# Patient Record
Sex: Female | Born: 1970 | Race: White | Hispanic: No | Marital: Married | Smoking: Never smoker
Health system: Southern US, Community
[De-identification: ages and names within clinical notes are randomized; demographics above are authoritative.]

## PROBLEM LIST (undated history)

## (undated) DIAGNOSIS — T783XXA Angioneurotic edema, initial encounter: Secondary | ICD-10-CM

## (undated) DIAGNOSIS — J302 Other seasonal allergic rhinitis: Secondary | ICD-10-CM

## (undated) DIAGNOSIS — M797 Fibromyalgia: Secondary | ICD-10-CM

## (undated) DIAGNOSIS — Z9889 Other specified postprocedural states: Secondary | ICD-10-CM

## (undated) DIAGNOSIS — E063 Autoimmune thyroiditis: Secondary | ICD-10-CM

## (undated) DIAGNOSIS — E785 Hyperlipidemia, unspecified: Secondary | ICD-10-CM

## (undated) DIAGNOSIS — R112 Nausea with vomiting, unspecified: Secondary | ICD-10-CM

## (undated) DIAGNOSIS — N951 Menopausal and female climacteric states: Secondary | ICD-10-CM

## (undated) DIAGNOSIS — G43909 Migraine, unspecified, not intractable, without status migrainosus: Secondary | ICD-10-CM

## (undated) DIAGNOSIS — K5900 Constipation, unspecified: Secondary | ICD-10-CM

## (undated) DIAGNOSIS — E039 Hypothyroidism, unspecified: Secondary | ICD-10-CM

## (undated) DIAGNOSIS — B019 Varicella without complication: Secondary | ICD-10-CM

## (undated) DIAGNOSIS — L509 Urticaria, unspecified: Secondary | ICD-10-CM

## (undated) DIAGNOSIS — K589 Irritable bowel syndrome without diarrhea: Secondary | ICD-10-CM

## (undated) HISTORY — DX: Urticaria, unspecified: L50.9

## (undated) HISTORY — DX: Other seasonal allergic rhinitis: J30.2

## (undated) HISTORY — DX: Fibromyalgia: M79.7

## (undated) HISTORY — DX: Angioneurotic edema, initial encounter: T78.3XXA

## (undated) HISTORY — PX: COLONOSCOPY: SHX174

## (undated) HISTORY — DX: Autoimmune thyroiditis: E06.3

## (undated) HISTORY — DX: Irritable bowel syndrome, unspecified: K58.9

## (undated) HISTORY — PX: WISDOM TOOTH EXTRACTION: SHX21

## (undated) HISTORY — DX: Constipation, unspecified: K59.00

## (undated) HISTORY — DX: Varicella without complication: B01.9

## (undated) HISTORY — DX: Menopausal and female climacteric states: N95.1

## (undated) HISTORY — DX: Hyperlipidemia, unspecified: E78.5

## (undated) HISTORY — DX: Migraine, unspecified, not intractable, without status migrainosus: G43.909

---

## 2002-12-16 ENCOUNTER — Inpatient Hospital Stay (HOSPITAL_COMMUNITY): Admission: AD | Admit: 2002-12-16 | Discharge: 2002-12-18 | Payer: Self-pay | Admitting: Obstetrics and Gynecology

## 2003-02-01 ENCOUNTER — Other Ambulatory Visit: Admission: RE | Admit: 2003-02-01 | Discharge: 2003-02-01 | Payer: Self-pay | Admitting: Obstetrics and Gynecology

## 2004-02-17 ENCOUNTER — Other Ambulatory Visit: Admission: RE | Admit: 2004-02-17 | Discharge: 2004-02-17 | Payer: Self-pay | Admitting: Obstetrics and Gynecology

## 2004-06-24 HISTORY — PX: DILATION AND CURETTAGE OF UTERUS: SHX78

## 2004-07-28 ENCOUNTER — Encounter (INDEPENDENT_AMBULATORY_CARE_PROVIDER_SITE_OTHER): Payer: Self-pay | Admitting: *Deleted

## 2004-07-28 ENCOUNTER — Ambulatory Visit (HOSPITAL_COMMUNITY): Admission: RE | Admit: 2004-07-28 | Discharge: 2004-07-28 | Payer: Self-pay | Admitting: Obstetrics and Gynecology

## 2004-09-13 ENCOUNTER — Emergency Department (HOSPITAL_COMMUNITY): Admission: EM | Admit: 2004-09-13 | Discharge: 2004-09-14 | Payer: Self-pay | Admitting: Emergency Medicine

## 2005-02-06 ENCOUNTER — Other Ambulatory Visit: Admission: RE | Admit: 2005-02-06 | Discharge: 2005-02-06 | Payer: Self-pay | Admitting: Obstetrics and Gynecology

## 2005-05-30 ENCOUNTER — Inpatient Hospital Stay (HOSPITAL_COMMUNITY): Admission: AD | Admit: 2005-05-30 | Discharge: 2005-05-30 | Payer: Self-pay | Admitting: Obstetrics and Gynecology

## 2005-05-31 ENCOUNTER — Ambulatory Visit (HOSPITAL_COMMUNITY): Admission: RE | Admit: 2005-05-31 | Discharge: 2005-05-31 | Payer: Self-pay | Admitting: Obstetrics and Gynecology

## 2005-08-14 ENCOUNTER — Inpatient Hospital Stay (HOSPITAL_COMMUNITY): Admission: AD | Admit: 2005-08-14 | Discharge: 2005-08-16 | Payer: Self-pay | Admitting: Obstetrics and Gynecology

## 2007-05-14 ENCOUNTER — Encounter: Payer: Self-pay | Admitting: Cardiology

## 2007-05-14 ENCOUNTER — Ambulatory Visit: Payer: Self-pay | Admitting: Internal Medicine

## 2007-05-14 LAB — CONVERTED CEMR LAB
ALT: 23 units/L (ref 0–35)
AST: 24 units/L (ref 0–37)
Albumin: 4.3 g/dL (ref 3.5–5.2)
Basophils Absolute: 0 10*3/uL (ref 0.0–0.1)
Bilirubin, Direct: 0.1 mg/dL (ref 0.0–0.3)
Calcium: 9.7 mg/dL (ref 8.4–10.5)
Chloride: 107 meq/L (ref 96–112)
Eosinophils Absolute: 0.1 10*3/uL (ref 0.0–0.6)
GFR calc Af Amer: 120 mL/min
GFR calc non Af Amer: 100 mL/min
H Pylori IgG: NEGATIVE
Iron: 117 ug/dL (ref 42–145)
Lymphocytes Relative: 33.6 % (ref 12.0–46.0)
MCHC: 35.1 g/dL (ref 30.0–36.0)
MCV: 91.7 fL (ref 78.0–100.0)
Monocytes Relative: 10.5 % (ref 3.0–11.0)
Neutro Abs: 2.5 10*3/uL (ref 1.4–7.7)
Platelets: 179 10*3/uL (ref 150–400)
RBC: 4.15 M/uL (ref 3.87–5.11)
Saturation Ratios: 31.1 % (ref 20.0–50.0)
Sed Rate: 10 mm/hr (ref 0–25)
Sodium: 141 meq/L (ref 135–145)

## 2007-05-27 ENCOUNTER — Ambulatory Visit (HOSPITAL_COMMUNITY): Admission: RE | Admit: 2007-05-27 | Discharge: 2007-05-27 | Payer: Self-pay | Admitting: Internal Medicine

## 2007-05-29 ENCOUNTER — Ambulatory Visit: Payer: Self-pay | Admitting: Internal Medicine

## 2007-06-03 ENCOUNTER — Ambulatory Visit: Payer: Self-pay | Admitting: Internal Medicine

## 2007-06-03 ENCOUNTER — Encounter: Payer: Self-pay | Admitting: Internal Medicine

## 2007-07-21 DIAGNOSIS — E039 Hypothyroidism, unspecified: Secondary | ICD-10-CM | POA: Insufficient documentation

## 2007-07-21 DIAGNOSIS — K589 Irritable bowel syndrome without diarrhea: Secondary | ICD-10-CM | POA: Insufficient documentation

## 2008-04-10 ENCOUNTER — Inpatient Hospital Stay (HOSPITAL_COMMUNITY): Admission: AD | Admit: 2008-04-10 | Discharge: 2008-04-12 | Payer: Self-pay | Admitting: Obstetrics and Gynecology

## 2008-07-22 ENCOUNTER — Ambulatory Visit (HOSPITAL_COMMUNITY): Admission: RE | Admit: 2008-07-22 | Discharge: 2008-07-22 | Payer: Self-pay | Admitting: Cardiovascular Disease

## 2009-09-27 ENCOUNTER — Encounter: Admission: RE | Admit: 2009-09-27 | Discharge: 2009-09-27 | Payer: Self-pay | Admitting: Endocrinology

## 2010-01-02 ENCOUNTER — Encounter: Admission: RE | Admit: 2010-01-02 | Discharge: 2010-01-02 | Payer: Self-pay | Admitting: Family Medicine

## 2010-07-15 ENCOUNTER — Encounter: Payer: Self-pay | Admitting: Internal Medicine

## 2010-11-06 NOTE — Discharge Summary (Signed)
[name] [name], [name] [name]             ACCOUNT NO.:  [id number]   MEDICAL RECORD NO.:  [id number]          PATIENT TYPE:  INP   LOCATION:  9117                          [facility]ILITY:  [facility]   PHYSICIAN:  Zenaida Niece, M.D.DATE OF BIRTH:  1970-09-07   DATE OF ADMISSION:  04/10/2008  DATE OF DISCHARGE:  04/12/2008                               DISCHARGE SUMMARY   ADMISSION DIAGNOSES:  1. Intrauterine pregnancy at 38-plus weeks.  2. Hypothyroidism.   DISCHARGE DIAGNOSES:  1. Intrauterine pregnancy at 38-plus weeks.  2. Hypothyroidism.   PROCEDURES:  On April 10, 2008, she had a spontaneous vaginal  delivery.   HISTORY AND PHYSICAL:  This is a 40 year old gravida 7, para 3-1-2-4  with an EGA of 38-plus weeks, who presents with a complaint of regular  contractions.  Evaluation in triage revealed contractions, and cervix  was 7-cm dilated with bulging bag of water.  Prenatal care was  complicated by advanced maternal age with a normal first-trimester  screening and normal ultrasound and was otherwise uncomplicated.   PRENATAL LABORATORIES:  Blood type is A negative with negative antibody  screen.  RPR nonreactive.  Rubella immune.  Hepatitis B surface antigen  negative.  HIV negative.  Gonorrhea and chlamydia negative.  First-  trimester screen is normal.  One-hour Glucola was 122.  Group B strep is  negative.   PAST OB HISTORY:  Vaginal delivery x4 at 36-40 weeks with the weights  ranging from 6 pounds 5 ounces and 8 pounds 7 ounces without  complications.  Spontaneous abortion x2.   PAST MEDICAL HISTORY:  1. History of mitral valve prolapse.  2. Hypothyroidism.  3. Migraine headaches.   MEDICATIONS:  Synthroid.   PHYSICAL EXAMINATION:  VITAL SIGNS:  She is afebrile with stable vital  signs.  Fetal heart tracing reactive with contractions every 4-5  minutes.  ABDOMEN:  Gravid and nontender with an estimated fetal weight of 7-1/2  pounds.  Cervix, on my first exam she is 8-9,  complete, 0, vertex  presentation, and membranes were ruptured for clear fluid.   HOSPITAL COURSE:  The patient was admitted in advanced labor.  Membranes  were ruptured.  She progressed quickly to complete, pushed well, and on  the morning of April 10, 2008, she had a vaginal delivery of a viable  female infant with Apgars of 9 and 9, weight 7 pounds 13 ounces.  Placenta delivered spontaneously and was intact.  She had a small first-  degree laceration repaired with 3-0 Vicryl with local block and  estimated blood loss was 500 mL.  Postpartum, she had no significant  complications.  Pre-delivery hemoglobin 13.5, post-delivery 11.1.  The  patient requested discharge on postpartum day #1, but the baby was not  allowed to go, so on postpartum day #2, she was discharged to home.   DISCHARGE INSTRUCTIONS:  Regular diet, pelvic rest, and follow up in 6  weeks.   MEDICATIONS:  Over-the-counter ibuprofen as needed and she was given our  discharge pamphlet.      Zenaida Niece, M.D.  Electronically Signed     TDM/MEDQ  D:  04/12/2008  T:  04/12/2008  Job:  [id number]

## 2010-11-06 NOTE — Assessment & Plan Note (Signed)
[facility]                         GASTROENTEROLOGY OFFICE NOTE   NAME:[name] [name] [name]                    MRN:          [id number]  DATE:05/14/2007                            DOB:          03/13/1971    NEW PATIENT EVALUATION   [name] [name] is a very nice 40 year old white female, mother of 4 children,  who seeks another opinion of irritable bowel syndrome/crampy abdominal  pain and bloating.  She saw Dr. Arlyce Dice in 2004 and underwent  colonoscopy, which was a normal exam, including random biopsies of the  colon.  She continues to have crampy abdominal pain, which starts in the  morning and is independent of eating.  She also feels some pain within  10 or 15 minutes after eating.  She is almost constantly aware of her  lower abdomen.  She denies any diarrhea or constipation.  Her bowel  habits are somewhat irregular.  She does not take any laxatives.  There  has been no blood per rectum.  She has had some pain with intercourse on  the rectal side of the vagina.  Her weight has been stable.  Eating  habits have been regular.  She has been quite busy being the mother of 4  children and working part-time as a Nurse, learning disability.  She has been  treated for hypothyroidism by Dr. Everardo All.   MEDICATIONS:  Synthroid.  Prenatal vitamin.  Calcium supplements.  Fish  oil.   PAST MEDICAL HISTORY:  Thyroid problems.   OPERATIONS:  None.  Four vaginal deliveries.   FAMILY HISTORY:  Positive for diabetes in grandparents and mother.  History of gallbladder disease in her mother with cholecystectomy age of  91.  Also, mother has abdominal pain.  There is a positive family  history of colon cancer in mother, sister, and maternal grandmother.  Positive family history of colon polyps in her mother.  She had an upper  endoscopy in 2001.  We do not have the record of it.   SOCIAL HISTORY:  Married with 4 children.  She has a master's degree.  Works as an Secondary school teacher at  [employer].  She does not smoke.  Does not drink  alcohol.   REVIEW OF SYSTEMS:  Positive for night sweats, back pain, dyspareunia,  arthritis.   PHYSICAL EXAM:  Blood pressure 94/70, pulse 82, weight 115 pounds.  She was alert and oriented, somewhat pale.  Fair skin.  No palmar erythema.  Oral cavity shows normal papillated tongue.  No cheilosis.  No aphthous  ulcers.  NECK:  Supple.  No adenopathy.  Thyroid is not enlarged.  LUNGS:  Clear to auscultation.  COR:  Normal S1, normal S2.  ABDOMEN:  Soft with minimal tenderness in the epigastric area, and some  discomfort in the left lower quadrant.  The right lower quadrant was  unremarkable.  Bowel sounds were hyperactive.  RECTAL:  Normal perianal area.  Rectal tone was normal.  There was some  discomfort on inner rectum.  Stool was Hemoccult negative.  EXTREMITIES:  No edema.   IMPRESSION:  33. A 40 year old white female with symptoms of boating, cramping  abdominal pain on a daily basis, previously evaluated with negative      colonoscopy.  She definitely has hyperactive bowel sounds, which      may indicate either irritable bowel syndrome, possibly bacterial      overgrowth, or some sort of malabsorption.  We need to rule out      celiac sprue, inflammatory bowel disease, lactose intolerance, or      possible metabolic causes, such as hypothyroidism.  2. Positive family history of gallbladder disease, rule out      symptomatic gallbladder disease.  3. Epigastric discomfort and dyspepsia, rule out gastritis,      Helicobacter pylori gastropathy.   PLAN:  1. Align samples given 1 p.o. daily.  2. Levsin sublingual 0.125 mg q.4h p.r.n. crampy abdominal pain.  3. Anusol HC suppositories for rectal discomfort, possibly      hemorrhoids.  4. Upper abdominal ultrasound.  5. Upper GI with small bowel follow-through.  6. CBC, CMET, sed rate, tissue transglutaminase levels, TSH, iron,      iron binding capacity, and H. pylori antibody.   I will see her      again in 4 to 6 weeks.     Hedwig Morton. Juanda Chance, MD  Electronically Signed    DMB/MedQ  DD: 05/14/2007  DT: 05/14/2007  Job #: [id number]   cc:   Tammy R. Collins [place], M.D.

## 2010-11-09 NOTE — H&P (Signed)
NAME:  [name] [name], [name] [name]             ACCOUNT NO.:  [id number]   MEDICAL RECORD NO.:  [id number]          PATIENT TYPE:  AMB   LOCATION:  SDC                           [facility]   PHYSICIAN:  Huel Cote, M.D. DATE OF BIRTH:  May 02, 1971   DATE OF ADMISSION:  07/28/2004  DATE OF DISCHARGE:                                HISTORY & PHYSICAL   The patient is a 40 year old, G5, P2-1-1-2 who is coming in for a dilation  and curettage given a finding of a nine week missed AB on her office  ultrasound at her prenatal visit.  By her LMP, the patient would be  approximately [redacted] weeks gestation; however, there was no fetal heart beat  noted on her physical examination and ultrasound confirmed a nine week fetal  demise.   PAST MEDICAL HISTORY:  Significant for allergic rhinitis, migraines,  hypothyroidism, irritable bowel syndrome and mitral valve prolapse.  She has  had no past surgeries.   PAST OBSTETRICAL HISTORY:  She had three spontaneous vaginal deliveries and  one miscarriage previously.   PAST GYN HISTORY:  Unremarkable.  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   CURRENT MEDICATIONS:  Synthroid.   ALLERGIES:  She has no known drug allergies.   PHYSICAL EXAMINATION:  VITAL SIGNS:  Weight is approximately 112 pounds,  blood pressure 100/70.  CARDIAC:  Regular rate and rhythm.  LUNGS:  Clear.  ABDOMEN:  Soft, nontender.  PELVIC:  She has normal external genitalia, cervix has no lesions, uterus is  approximately 9-10 weeks in size. Her adnexa are normal.  The patient was  counseled to the risks and benefits of D&C including possible uterine  perforation and bleeding.  She understands these risks and desires to  proceed with the procedure as stated.  The patient is Rh negative and will  require RhoGAM postoperatively. Also given the history of the balance  translocation,  it was discussed with her sending the fetal tissue for  chromosomes and she is agreeable to this.      KR/MEDQ  D:  07/27/2004  T:  07/27/2004  Job:  [id number]

## 2010-11-09 NOTE — Discharge Summary (Signed)
NAMEYALDA, HERD             ACCOUNT NO.:  [id number]   MEDICAL RECORD NO.:  [id number]          PATIENT TYPE:  INP   LOCATION:  9115                          [facility]   PHYSICIAN:  Malachi Pro. Ambrose Mantle, M.D. DATE OF BIRTH:  [birth year]   DATE OF ADMISSION:  08/14/2005  DATE OF DISCHARGE:                                 DISCHARGE SUMMARY   40 year old white married female para 3-0-2-3, gravida 41, [facility] August 23, 2005  by ultrasound admitted with premature rupture of the membranes.  Blood group  and type A- with negative antibody, nonreactive serology, rubella immune.  Hepatitis B surface antigen negative.  HIV declined.  GC and Chlamydia  negative.  One-hour Glucola 145.  Three-hour GTT 75, 139, 97, and 72.  AFP  was normal.  Group B Strep negative.  Vaginal ultrasound at January 25, 2005  crown rump length 2.59 cm 10 weeks 0 days, [facility] August 23, 2005.  Nuchal  translucency was normal.  Ultrasound on May 31, 2006 for size less than  dates showed normal growth.  Prenatal care was uncomplicated.  The patient  was seen in the office on the day of admission with a positive fern test.  She came here and the cervix was thought to be 6 cm.  Epidural was given.   PAST MEDICAL HISTORY:   ALLERGIES:  No known drug allergies.   ILLNESSES:  1.  Migraines.  2.  Mitral valve prolapse.  3.  Hypothyroidism.   OPERATIONS:  1.  1992 laparotomy.  2.  D&C in 2006.   Alcohol, tobacco, and drugs:  None.   FAMILY HISTORY:  Mother with high blood pressure, diabetes, anxiety, and  depression.   OBSTETRICAL HISTORY:  In August of 1999 a 6 pound 5 ounce female 36 weeks  vaginally.  2001 a spontaneous abortion.  January of 2002 an 8 pound 7 ounce  female vaginally.  June of 2004 a 7 pound 11 ounce female vaginally.  February  of 2006 fetal demise at 9 weeks followed by a D&C.   On admission her vital signs were normal.  Her physical examination was  normal.  The patient had been thought to be 6  cm but when I examined her she  was 4-5 cm, 80%.  Artificial rupture of the membranes produced bloody fluid.  The patient must have had a high leak because the amniotic sac was intact.  She then progressed to full dilatation, delivered spontaneously OA over a  second-degree midline laceration a living female infant 8 pounds 4 ounces by  Dr. Ambrose Mantle.  Apgars were 9 at one and 9 at five minutes.  There was one loop  of nuchal cord that was loose.  The uterus was normal, rectal negative.  The  patient had poor perineal support and weak sphincter.  Laceration repaired  with 3-0 Vicryl.  Blood loss about 400 mL.  Postpartum the patient did well  and was discharged on the second postpartum day.  Initial hemoglobin 12.5,  hematocrit 36.3, white count 9400, platelet count 173,000.  Follow-up  hemoglobin 11.2, platelet count 131,000.  RPR was  nonreactive.  The baby was  Rh negative.  The mother was not a candidate for RhoGAM.   FINAL DIAGNOSES:  Intrauterine pregnancy at 38+ weeks, delivered OA.   OPERATION:  1.  Spontaneous delivery OA.  2.  Repair of second-degree midline laceration.   FINAL CONDITION:  Improved.   INSTRUCTIONS:  Regular discharge instruction booklet.  Motrin 600 mg 24  tablets one every six hours as needed for pain.  Percocet 5/325 20 tablets  one every four to six hours as needed for pain.  Patient advised to return  to the office in six weeks for follow-up examination.      Malachi Pro. Ambrose Mantle, M.D.  Electronically Signed     TFH/MEDQ  D:  08/16/2005  T:  08/16/2005  Job:  [id number]

## 2010-11-09 NOTE — Op Note (Signed)
[name] [name], [name] [name]             ACCOUNT NO.:  [id number]   MEDICAL RECORD NO.:  [id number]          PATIENT TYPE:  AMB   LOCATION:  SDC                           [facility]   PHYSICIAN:  Zenaida Niece, M.D.DATE OF BIRTH:  1970-09-05   DATE OF PROCEDURE:  07/28/2004  DATE OF DISCHARGE:                                 OPERATIVE REPORT   PREOPERATIVE DIAGNOSIS:  Missed abortion at 9 weeks.   POSTOPERATIVE DIAGNOSIS:  Missed abortion at 9 weeks.   OPERATION:  Dilation and evacuation.   SURGEON:  Zenaida Niece, M.D.   ANESTHESIA:  Monitored anesthesia care.   SPECIMENS:  Products of conception.   ESTIMATED BLOOD LOSS:  50 mL.   FINDINGS:  9-week size retroverted uterus, and moderate products of  conception obtained.   PROCEDURE IN DETAIL:  The patient was taken to the operating room and placed  in the dorsal supine position.  She was given IV sedation and placed in  mobile stirrups.  The perineum and vagina were prepped and draped in the  usual sterile fashion and bladder drained with a red rubber catheter.  A  Graves speculum was inserted into the vagina and the anterior lip of the  cervix was grasped with a single-tooth tenaculum.  A paracervical block was  then performed with 16 mL of 2% lidocaine.  The uterus then sounded to 11  cm.  The cervix was gradually dilated to a size 27 dilator, to accommodate a  size 9 curved curette.  Suction curettage was then performed with return of  moderate products of conception.  Sharp curettage was then performed with  minimal tissue in the left fundus and good uterine cry in all quadrants.  Suction curettage was performed one more time with return of minimal blood.  The single-tooth tenaculum was removed and bleeding controlled with  pressure.  All instruments were then removed from the vagina.  The patient  tolerated the procedure well, and was taken to the recovery room in stable  condition.  Counts were correct.      TDM/MEDQ  D:  07/28/2004  T:  07/28/2004  Job:  [id number]

## 2010-11-09 NOTE — Discharge Summary (Signed)
NAME:  [name] [name], [name] [name]                       ACCOUNT NO.:  [id number]   MEDICAL RECORD NO.:  [id number]                   PATIENT TYPE:  INP   LOCATION:  9107                                 [facility]   PHYSICIAN:  Zenaida Niece, M.D.             DATE OF BIRTH:  [birth year]   DATE OF ADMISSION:  12/16/2002  DATE OF DISCHARGE:  12/18/2002                                 DISCHARGE SUMMARY   ADMISSION DIAGNOSIS:  Intrauterine pregnancy at 38 weeks.   DISCHARGE DIAGNOSIS:  Intrauterine pregnancy at 38 weeks.   PROCEDURE:  On December 16, 2002, she had [name] spontaneous vaginal delivery.   HISTORY AND PHYSICAL:  This is [name] 40 year old white female, gravida 4, para 1-  1-1-2, with an EGA at 77 weeks who presents for induction with [name] history of  [name] one hour labor and some irregular contractions.  Prenatal care initially  in [place].  Transferred to our practice at 32 weeks.  Prenatal care  complicated by the fact that by amniocentesis this baby is 36 XX and has [name]  balanced translocation.   PRENATAL LABORATORY DATA:  Blood type [name] negative with negative antibody  screen.  RPR nonreactive.  Rubella immune.  Hepatitis B surface antigen  negative.  Cystic fibrosis negative.  Three hour Glucola was normal.  Group  B Streptococcus was negative.   PAST OBSTETRICAL HISTORY:  In 1999, [name] vaginal delivery 6 pounds 5 ounces.  In 2002, [name] vaginal delivery 8 pounds 7 ounces, and one spontaneous abortion.   PAST MEDICAL HISTORY:  1. Hypothyroidism.  2. Mitral valve prolapse.   PAST SURGICAL HISTORY:  Laparoscopy in 1992.   MEDICATIONS:  1. Cipro.  2. Prenatal vitamins.   PHYSICAL EXAMINATION:  VITAL SIGNS:  She is afebrile with stable vital  signs.  Fetal heart tracing is reactive with contractions q.5-7 minutes.  ABDOMEN:  Gravid with estimated fetal weight of 6.5-7 pounds .  PELVIC:  Cervix is 2-3, 15, -2 and amniotomy reveals clear fluid.   HOSPITAL COURSE:  The patient was admitted with  contractions and Dr.  Senaida Ores performed amniotomy.  She continued to progress and received an  epidural.  She was also started on Pitocin.  She was given ampicillin for  mitral valve prolapse per patient's request.  On the afternoon of December 16, 2002, she had [name] vaginal delivery of [name] viable female infant with APGARS of  9/9 who weighed 7 pounds 11 ounces.  Placenta delivered spontaneously,  intact.  She had [name] small second degree laceration repaired with 2-0 Vicryl.  Estimated blood loss 300 mL.  Cord blood was collected.   Postpartum she breast and bottle fed her baby and had no complications.   Predelivery hemoglobin 11.6, postdelivery 9.9.  On the morning of postpartum  day #2, she was felt to be stable enough for discharge home.   DISCHARGE INSTRUCTIONS:  1. Regular diet.  2. Pelvic  rest.  3. Followup in six weeks.   MEDICATIONS:  Over-the-counter Motrin as needed.   She is given our discharge pamphlet.                                               Zenaida Niece, M.D.    TDM/MEDQ  D:  12/18/2002  T:  12/19/2002  Job:  [id number]

## 2010-11-13 ENCOUNTER — Other Ambulatory Visit: Payer: Self-pay | Admitting: Obstetrics & Gynecology

## 2010-11-13 DIAGNOSIS — N938 Other specified abnormal uterine and vaginal bleeding: Secondary | ICD-10-CM

## 2010-11-13 DIAGNOSIS — R1032 Left lower quadrant pain: Secondary | ICD-10-CM

## 2010-11-14 ENCOUNTER — Ambulatory Visit (HOSPITAL_COMMUNITY)
Admission: RE | Admit: 2010-11-14 | Discharge: 2010-11-14 | Disposition: A | Discharge status: Home / Self Care | Payer: BLUE CROSS/BLUE SHIELD | Source: Ambulatory Visit | Attending: Obstetrics & Gynecology | Admitting: Obstetrics & Gynecology

## 2010-11-14 DIAGNOSIS — R1032 Left lower quadrant pain: Secondary | ICD-10-CM

## 2010-11-14 DIAGNOSIS — N938 Other specified abnormal uterine and vaginal bleeding: Secondary | ICD-10-CM

## 2010-11-14 DIAGNOSIS — N949 Unspecified condition associated with female genital organs and menstrual cycle: Secondary | ICD-10-CM | POA: Insufficient documentation

## 2011-03-25 LAB — RPR: RPR Ser Ql: NONREACTIVE

## 2011-03-25 LAB — CBC
HCT: 39.9
MCHC: 34
MCV: 93.9
MCV: 94.8
Platelets: 132 — ABNORMAL LOW
Platelets: 152
RBC: 3.44 — ABNORMAL LOW
RDW: 13.2
RDW: 13.5
WBC: 9.7

## 2011-08-12 ENCOUNTER — Encounter (HOSPITAL_COMMUNITY): Payer: Self-pay | Admitting: *Deleted

## 2011-08-12 ENCOUNTER — Encounter (HOSPITAL_COMMUNITY): Payer: Self-pay | Admitting: Pharmacist

## 2011-08-13 NOTE — H&P (Addendum)
[name] [name] is an 41 y.o. female. [id number] with an unfortunate finding of a missed abortion on [place] and CRL of 8+weeks.  She has had no bleeding or cramping.  After discussion of options she elects to proceed with a suction D&E. We discussed risks of bleeding, infection and uterine perforation specifically.    Pertinent Gynecological History: No abnormal paps  Menstrual History:  LMP 05/27/11  Past Medical History  Diagnosis Date  . Hypothyroidism   . PONV (postoperative nausea and vomiting)     Past Surgical History  Procedure Date  . Dilation and curettage of uterus 2006    missed ab  . Colonoscopy     History reviewed. No pertinent family history.  Social History:  reports that she has never smoked. She does not have any smokeless tobacco history on file. She reports that she does not drink alcohol or use illicit drugs.  Allergies: No Known Allergies  No prescriptions prior to admission    ROS  There were no vitals taken for this visit. Physical Exam  Constitutional: She is oriented to person, place, and time. She appears well-developed and well-nourished.  Cardiovascular: Normal rate and regular rhythm.   Respiratory: Effort normal and breath sounds normal.  GI: Soft. Bowel sounds are normal.  Genitourinary: Vagina normal.       Uterus at about 8 weeks size  Neurological: She is alert and oriented to person, place, and time.  Psychiatric: She has a normal mood and affect. Her behavior is normal.      Assessment/Plan: Pt has a missed abortion and has elected to proceed with suction D&E after we discussed risks and benefits.  Her blood type is Rh negative and she will need rhogam post-operatively.  Cypher Paule W 08/13/2011, 11:04 AM  Per pt no changes in dictated H&P and brief exam WNL.

## 2011-08-14 ENCOUNTER — Ambulatory Visit (HOSPITAL_COMMUNITY)
Admission: RE | Admit: 2011-08-14 | Discharge: 2011-08-14 | Disposition: A | Discharge status: Home / Self Care | Payer: BC Managed Care – PPO | Source: Ambulatory Visit | Attending: Obstetrics and Gynecology | Admitting: Obstetrics and Gynecology

## 2011-08-14 ENCOUNTER — Encounter (HOSPITAL_COMMUNITY): Payer: Self-pay | Admitting: Anesthesiology

## 2011-08-14 ENCOUNTER — Encounter (HOSPITAL_COMMUNITY)
Admission: RE | Disposition: A | Discharge status: Home / Self Care | Payer: Self-pay | Source: Ambulatory Visit | Attending: Obstetrics and Gynecology

## 2011-08-14 ENCOUNTER — Ambulatory Visit (HOSPITAL_COMMUNITY): Payer: BC Managed Care – PPO | Admitting: Anesthesiology

## 2011-08-14 ENCOUNTER — Other Ambulatory Visit: Payer: Self-pay | Admitting: Obstetrics and Gynecology

## 2011-08-14 ENCOUNTER — Encounter (HOSPITAL_COMMUNITY): Payer: Self-pay | Admitting: *Deleted

## 2011-08-14 DIAGNOSIS — O021 Missed abortion: Secondary | ICD-10-CM | POA: Insufficient documentation

## 2011-08-14 HISTORY — DX: Nausea with vomiting, unspecified: Z98.890

## 2011-08-14 HISTORY — DX: Nausea with vomiting, unspecified: R11.2

## 2011-08-14 HISTORY — PX: DILATION AND EVACUATION: SHX1459

## 2011-08-14 HISTORY — DX: Hypothyroidism, unspecified: E03.9

## 2011-08-14 LAB — CBC
MCH: 30.5 pg (ref 26.0–34.0)
MCHC: 33.3 g/dL (ref 30.0–36.0)
Platelets: 205 10*3/uL (ref 150–400)
RDW: 13.8 % (ref 11.5–15.5)

## 2011-08-14 SURGERY — DILATION AND EVACUATION, UTERUS
Anesthesia: Monitor Anesthesia Care | Site: Uterus | Wound class: Clean Contaminated

## 2011-08-14 MED ORDER — KETOROLAC TROMETHAMINE 30 MG/ML IJ SOLN
INTRAMUSCULAR | Status: AC
  Filled 2011-08-14: qty 1

## 2011-08-14 MED ORDER — MIDAZOLAM HCL 5 MG/5ML IJ SOLN
INTRAMUSCULAR | Status: DC | PRN
  Administered 2011-08-14: 2 mg via INTRAVENOUS

## 2011-08-14 MED ORDER — RHO D IMMUNE GLOBULIN 1500 UNIT/2ML IJ SOLN
300.0000 ug | Freq: Once | INTRAMUSCULAR | Status: AC
  Administered 2011-08-14: 300 ug via INTRAVENOUS

## 2011-08-14 MED ORDER — FENTANYL CITRATE 0.05 MG/ML IJ SOLN
INTRAMUSCULAR | Status: AC
  Filled 2011-08-14: qty 2

## 2011-08-14 MED ORDER — LIDOCAINE HCL 1 % IJ SOLN
INTRAMUSCULAR | Status: DC | PRN
  Administered 2011-08-14: 20 mL

## 2011-08-14 MED ORDER — PROPOFOL 10 MG/ML IV EMUL
INTRAVENOUS | Status: DC | PRN
  Administered 2011-08-14: 200 ug/kg/min via INTRAVENOUS

## 2011-08-14 MED ORDER — KETOROLAC TROMETHAMINE 30 MG/ML IJ SOLN
30.0000 mg | Freq: Once | INTRAMUSCULAR | Status: AC
  Administered 2011-08-14: 30 mg via INTRAVENOUS

## 2011-08-14 MED ORDER — PROPOFOL 10 MG/ML IV EMUL
INTRAVENOUS | Status: DC | PRN
  Administered 2011-08-14: 50 mg via INTRAVENOUS

## 2011-08-14 MED ORDER — MIDAZOLAM HCL 2 MG/2ML IJ SOLN
INTRAMUSCULAR | Status: AC
  Filled 2011-08-14: qty 2

## 2011-08-14 MED ORDER — SILVER NITRATE-POT NITRATE 75-25 % EX MISC
CUTANEOUS | Status: DC | PRN
  Administered 2011-08-14: 1

## 2011-08-14 MED ORDER — LACTATED RINGERS IV SOLN
INTRAVENOUS | Status: DC
  Administered 2011-08-14: 12:00:00 via INTRAVENOUS

## 2011-08-14 MED ORDER — LIDOCAINE HCL (CARDIAC) 20 MG/ML IV SOLN
INTRAVENOUS | Status: DC | PRN
  Administered 2011-08-14: 60 mg via INTRAVENOUS

## 2011-08-14 MED ORDER — FENTANYL CITRATE 0.05 MG/ML IJ SOLN
INTRAMUSCULAR | Status: DC | PRN
  Administered 2011-08-14 (×2): 25 ug via INTRAVENOUS

## 2011-08-14 MED ORDER — ONDANSETRON HCL 4 MG/2ML IJ SOLN
INTRAMUSCULAR | Status: DC | PRN
  Administered 2011-08-14: 4 mg via INTRAVENOUS

## 2011-08-14 MED ORDER — FENTANYL CITRATE 0.05 MG/ML IJ SOLN: 25.0000 ug | INTRAMUSCULAR | Status: DC | PRN

## 2011-08-14 SURGICAL SUPPLY — 19 items
CATH ROBINSON RED A/P 16FR (CATHETERS) ×2 IMPLANT
CLOTH BEACON ORANGE TIMEOUT ST (SAFETY) ×2 IMPLANT
DECANTER SPIKE VIAL GLASS SM (MISCELLANEOUS) ×2 IMPLANT
GLOVE BIO SURGEON STRL SZ 6.5 (GLOVE) ×4 IMPLANT
GOWN PREVENTION PLUS LG XLONG (DISPOSABLE) ×4 IMPLANT
KIT BERKELEY 1ST TRIMESTER 3/8 (MISCELLANEOUS) ×2 IMPLANT
NDL SPNL 22GX3.5 QUINCKE BK (NEEDLE) ×1 IMPLANT
NEEDLE SPNL 22GX3.5 QUINCKE BK (NEEDLE) ×2 IMPLANT
NS IRRIG 1000ML POUR BTL (IV SOLUTION) ×2 IMPLANT
PACK VAGINAL MINOR WOMEN LF (CUSTOM PROCEDURE TRAY) ×2 IMPLANT
PAD PREP 24X48 CUFFED NSTRL (MISCELLANEOUS) ×2 IMPLANT
SET BERKELEY SUCTION TUBING (SUCTIONS) ×2 IMPLANT
SYR CONTROL 10ML LL (SYRINGE) ×2 IMPLANT
TOWEL OR 17X24 6PK STRL BLUE (TOWEL DISPOSABLE) ×4 IMPLANT
VACURETTE 10 RIGID CVD (CANNULA) IMPLANT
VACURETTE 7MM CVD STRL WRAP (CANNULA) IMPLANT
VACURETTE 8 RIGID CVD (CANNULA) IMPLANT
VACURETTE 9 RIGID CVD (CANNULA) ×2 IMPLANT
WATER STERILE IRR 1000ML POUR (IV SOLUTION) ×1 IMPLANT

## 2011-08-14 NOTE — Anesthesia Preprocedure Evaluation (Addendum)
Anesthesia Evaluation  Patient identified by MRN, date of birth, ID band Patient awake    Reviewed: Allergy & Precautions, H&P , Patient's Chart, lab work & pertinent test results, reviewed documented beta blocker date and time   Airway Mallampati: II TM Distance: >3 FB Neck ROM: full    Dental No notable dental hx.    Pulmonary  clear to auscultation  Pulmonary exam normal       Cardiovascular regular Normal    Neuro/Psych    GI/Hepatic   Endo/Other  Hypothyroidism   Renal/GU      Musculoskeletal   Abdominal   Peds  Hematology   Anesthesia Other Findings   Reproductive/Obstetrics                           Anesthesia Physical Anesthesia Plan  ASA: II  Anesthesia Plan: General and MAC   Post-op Pain Management:    Induction: Intravenous  Airway Management Planned: LMA and Mask  Additional Equipment:   Intra-op Plan:   Post-operative Plan:   Informed Consent: I have reviewed the patients History and Physical, chart, labs and discussed the procedure including the risks, benefits and alternatives for the proposed anesthesia with the patient or authorized representative who has indicated his/her understanding and acceptance.   Dental Advisory Given  Plan Discussed with: CRNA and Surgeon  Anesthesia Plan Comments: (  Discussed  MAC or general anesthesia, including possible nausea, instrumentation of airway, sore throat,pulmonary aspiration, etc. I asked if the were any outstanding questions, or  concerns before we proceeded. )       Anesthesia Quick Evaluation

## 2011-08-14 NOTE — Transfer of Care (Signed)
Immediate Anesthesia Transfer of Care Note  Patient: [name] [name]  Procedure(s) Performed: Procedure(s) (LRB): DILATATION AND EVACUATION (N/A)  Patient Location: PACU  Anesthesia Type: MAC  Level of Consciousness: awake, alert  and oriented  Airway & Oxygen Therapy: Patient Spontanous Breathing  Post-op Assessment: Report given to PACU RN  Post vital signs: Reviewed and stable  Complications: No apparent anesthesia complications

## 2011-08-14 NOTE — Discharge Instructions (Signed)
DISCHARGE INSTRUCTIONS: D&E The following instructions have been prepared to help you care for yourself upon your return home.   Personal hygiene: Marland Kitchen Use sanitary pads for vaginal drainage, not tampons. . Shower the day after your procedure. . NO tub baths, pools or Jacuzzis for 2-3 weeks. . Wipe front to back after using the bathroom.  Activity and limitations: . Do NOT drive or operate any equipment for 24 hours. The effects of anesthesia are still present and drowsiness may result. . Do NOT rest in bed all day. . Walking is encouraged. . Walk up and down stairs slowly. . You may resume your normal activity in one to two days or as indicated by your physician.  Sexual activity: NO intercourse for at least 2 weeks after the procedure, or as indicated by your physician.  Diet: Eat a light meal as desired this evening. You may resume your usual diet tomorrow.  Return to work: You may resume your work activities in one to two days or as indicated by your doctor.  What to expect after your surgery: Expect to have vaginal bleeding/discharge for 2-3 days and spotting for up to 10 days. It is not unusual to have soreness for up to 1-2 weeks. You may have a slight burning sensation when you urinate for the first day. Mild cramps may continue for a couple of days. You may have a regular period in 2-6 weeks.  Call your doctor for any of the following: . Excessive vaginal bleeding, saturating and changing one pad every hour. . Inability to urinate 6 hours after discharge from hospital. . Pain not relieved by pain medication. . Fever of 100.4 F or greater. . Unusual vaginal discharge or odor.

## 2011-08-14 NOTE — Op Note (Signed)
Operative note  Preoperative diagnosis Missed abortion at [redacted] weeks gestation crown-rump length  Postoperative diagnosis Same  Procedure Suction dilation and evacuation  Surgeon Dr. Huel Cote  Anesthesia Paracervical block with IV sedation  Findings The uterus measured approximately 9 cm and had a moderate amount of products of conception obtained  Fluids Estimated blood loss 50 cc Urine output 50 cc straight catheter prior procedure IV fluids 1100 cc LR  Pathology Products of conception were sent  Procedure note Patient was taken to the operating room where IV sedation was obtained without difficulty. An appropriate time out was performed and she was prepped and draped in the normal sterile fashion in the dorsal lithotomy position. A speculum was placed within the vagina and the cervix identified and injected with 2 cc of 1% plain lidocaine on the anterior lip of the cervix. This was then grasped with a single-tooth tenaculum and an additional 9 cc each was placed at 1 and 10:00 on the cervix for a paracervical block. The uterus was then easily sounded to 9 cm and a 9 cm suction curet obtained. The Pratt dilators were utilized and passed very easily do to the patient's prior Cytotec treatment. The 9 mm suction curette was introduced into the uterine fundus and into passes a moderate amount of products of conception obtained. On the third pass no further tissue was noted. The suction was discontinued and a curettage performed with no additional tissue felt in the uterine fundus. 2 additional passes of the suction confirmed that there is no significant tissue left behind and all instruments and sponges were then removed from the vagina. The tenaculum site was bleeding on th right and was treated with silver nitrate with good hemostasis.  All instruments and sponge counts were correct and the patient was taken to the recovery room in good condition.

## 2011-08-14 NOTE — Brief Op Note (Signed)
08/14/2011  12:38 PM  PATIENT:  [name] [name]  41 y.o. female  PRE-OPERATIVE DIAGNOSIS:  missed AB  POST-OPERATIVE DIAGNOSIS:  Missed Abortion  PROCEDURE:  Procedure(s) (LRB): DILATATION AND EVACUATION (N/A)  SURGEON:  Surgeon(s) and Role:    * Oliver Pila, MD - Primary   ANESTHESIA:   IV sedation and paracervical block  EBL:  Total I/O In: 700 [I.V.:700] Out: 25 [Urine:25]  BLOOD ADMINISTERED:none  DRAINS: none   LOCAL MEDICATIONS USED:  LIDOCAINE   SPECIMEN:  POC's  DISPOSITION OF SPECIMEN:  PATHOLOGY  COUNTS:  YES  TOURNIQUET:  * No tourniquets in log *  DICTATION: .Dragon Dictation  PLAN OF CARE: Discharge to home after PACU  PATIENT DISPOSITION:  PACU - hemodynamically stable.

## 2011-08-15 LAB — RH IG WORKUP (INCLUDES ABO/RH)
ABO/RH(D): A NEG
Antibody Screen: NEGATIVE
Unit division: 0

## 2011-08-15 NOTE — Anesthesia Postprocedure Evaluation (Signed)
  Anesthesia Post-op Note  Patient: [name] [name]  Procedure(s) Performed: Procedure(s) (LRB): DILATATION AND EVACUATION (N/A) Patient is awake and responsive. Pain and nausea are reasonably well controlled. Vital signs are stable and clinically acceptable. Oxygen saturation is clinically acceptable. There are no apparent anesthetic complications at this time. Patient is ready for discharge.

## 2011-08-16 ENCOUNTER — Encounter (HOSPITAL_COMMUNITY): Payer: Self-pay | Admitting: Obstetrics and Gynecology

## 2011-08-23 DEATH — deceased

## 2012-05-25 ENCOUNTER — Other Ambulatory Visit: Payer: Self-pay | Admitting: Obstetrics and Gynecology

## 2012-05-25 DIAGNOSIS — Z1231 Encounter for screening mammogram for malignant neoplasm of breast: Secondary | ICD-10-CM

## 2012-07-02 ENCOUNTER — Other Ambulatory Visit: Payer: Self-pay | Admitting: Endocrinology

## 2012-07-02 DIAGNOSIS — E049 Nontoxic goiter, unspecified: Secondary | ICD-10-CM

## 2012-07-03 ENCOUNTER — Ambulatory Visit
Admission: RE | Admit: 2012-07-03 | Discharge: 2012-07-03 | Disposition: A | Discharge status: Home / Self Care | Payer: BC Managed Care – PPO | Source: Ambulatory Visit | Attending: Endocrinology | Admitting: Endocrinology

## 2012-07-03 ENCOUNTER — Ambulatory Visit
Admission: RE | Admit: 2012-07-03 | Discharge: 2012-07-03 | Disposition: A | Discharge status: Home / Self Care | Payer: BC Managed Care – PPO | Source: Ambulatory Visit | Attending: Obstetrics and Gynecology | Admitting: Obstetrics and Gynecology

## 2012-07-03 DIAGNOSIS — Z1231 Encounter for screening mammogram for malignant neoplasm of breast: Secondary | ICD-10-CM

## 2012-07-03 DIAGNOSIS — E049 Nontoxic goiter, unspecified: Secondary | ICD-10-CM

## 2012-07-07 ENCOUNTER — Ambulatory Visit: Payer: BC Managed Care – PPO

## 2012-07-09 ENCOUNTER — Other Ambulatory Visit: Payer: Self-pay | Admitting: Obstetrics and Gynecology

## 2012-07-09 DIAGNOSIS — R928 Other abnormal and inconclusive findings on diagnostic imaging of breast: Secondary | ICD-10-CM

## 2012-07-14 ENCOUNTER — Ambulatory Visit
Admission: RE | Admit: 2012-07-14 | Discharge: 2012-07-14 | Disposition: A | Discharge status: Home / Self Care | Payer: BC Managed Care – PPO | Source: Ambulatory Visit | Attending: Obstetrics and Gynecology | Admitting: Obstetrics and Gynecology

## 2012-07-14 DIAGNOSIS — R928 Other abnormal and inconclusive findings on diagnostic imaging of breast: Secondary | ICD-10-CM

## 2012-12-07 ENCOUNTER — Other Ambulatory Visit: Payer: Self-pay | Admitting: Obstetrics and Gynecology

## 2012-12-07 DIAGNOSIS — R921 Mammographic calcification found on diagnostic imaging of breast: Secondary | ICD-10-CM

## 2012-12-23 ENCOUNTER — Ambulatory Visit
Admission: RE | Admit: 2012-12-23 | Discharge: 2012-12-23 | Disposition: A | Discharge status: Home / Self Care | Payer: BC Managed Care – PPO | Source: Ambulatory Visit | Attending: Obstetrics and Gynecology | Admitting: Obstetrics and Gynecology

## 2012-12-23 DIAGNOSIS — R921 Mammographic calcification found on diagnostic imaging of breast: Secondary | ICD-10-CM

## 2013-05-25 ENCOUNTER — Other Ambulatory Visit: Payer: Self-pay

## 2013-05-25 DIAGNOSIS — Z1231 Encounter for screening mammogram for malignant neoplasm of breast: Secondary | ICD-10-CM

## 2013-06-29 ENCOUNTER — Institutional Professional Consult (permissible substitution): Payer: BC Managed Care – PPO | Admitting: Nurse Practitioner

## 2013-07-05 ENCOUNTER — Ambulatory Visit: Payer: BC Managed Care – PPO

## 2013-07-06 ENCOUNTER — Ambulatory Visit
Admission: RE | Admit: 2013-07-06 | Discharge: 2013-07-06 | Disposition: A | Discharge status: Home / Self Care | Payer: BC Managed Care – PPO | Source: Ambulatory Visit

## 2013-07-06 DIAGNOSIS — Z1231 Encounter for screening mammogram for malignant neoplasm of breast: Secondary | ICD-10-CM

## 2014-06-24 HISTORY — PX: COLONOSCOPY: SHX174

## 2015-04-17 ENCOUNTER — Ambulatory Visit: Payer: BC Managed Care – PPO | Admitting: Podiatry

## 2015-12-15 ENCOUNTER — Ambulatory Visit: Payer: Self-pay

## 2015-12-15 ENCOUNTER — Telehealth: Payer: Self-pay | Admitting: *Deleted

## 2015-12-15 ENCOUNTER — Ambulatory Visit (INDEPENDENT_AMBULATORY_CARE_PROVIDER_SITE_OTHER): Payer: BC Managed Care – PPO

## 2015-12-15 ENCOUNTER — Ambulatory Visit (INDEPENDENT_AMBULATORY_CARE_PROVIDER_SITE_OTHER): Payer: BC Managed Care – PPO | Admitting: Podiatry

## 2015-12-15 ENCOUNTER — Encounter: Payer: Self-pay | Admitting: Podiatry

## 2015-12-15 VITALS — BP 90/63 | HR 78 | Resp 12

## 2015-12-15 DIAGNOSIS — D3610 Benign neoplasm of peripheral nerves and autonomic nervous system, unspecified: Secondary | ICD-10-CM | POA: Diagnosis not present

## 2015-12-15 DIAGNOSIS — M79671 Pain in right foot: Secondary | ICD-10-CM

## 2015-12-15 DIAGNOSIS — M79672 Pain in left foot: Secondary | ICD-10-CM

## 2015-12-15 DIAGNOSIS — S92901S Unspecified fracture of right foot, sequela: Secondary | ICD-10-CM

## 2015-12-15 NOTE — Telephone Encounter (Addendum)
Orders to [facility] for pre-cert. [id number] OF [place] AUTHORIZED MRI 96295 RIGHT FOOT WITH AND WITHOUT CONTRAST, ORDER ID: [id number], VALID 12/15/2015 TO 01/13/2016. 12/21/2015-Pt states her out-of-pocket would be over $1200.00 for the MRI, is there an alternative. 12/22/2015-Left message informing pt Dr. Jacqualyn Posey would proceed with custom orthotics.  I would have a pre-certing staff member check her insurance coverage and call her to set up an appt. Sedalia Muta states pt's orthotics are covered 100%, and she spoke with pt to schedule orthotic scanning and pt said she didn't call about orthotic coverage, she had called about what to do for since she wasn't having the MRI.  I told pt I had left a message informing her that the orthotics were the next step if she wasn't going to have the MRI. Pt asked what were they going to diagnosis, I told pt often therapies are used to r/o different diagnosis, example if the nerve pain is no longer present, then the orthotics must have move the MPJ off the offending nerve.  I offered pt an appt with Dr. Jacqualyn Posey and then scanned. Pt states understanding and wanted to be scanned without the doctor appt, because $85.00 each doctor visit. Pt states Dr. Jacqualyn Posey had said he would order a fungal toenail treatment from a place in [place], and she hasn't heard from them.  Dr. Jacqualyn Posey ordered Onychomycosis Nail Lacquer from [facility] +11refills. Faxed to [facility].

## 2015-12-15 NOTE — Progress Notes (Signed)
   Subjective:    Patient ID: [name] [name], female    DOB: July 26, 1970, 45 y.o.   MRN: [id number]  HPI  45 year old female presents the also concerns of right foot pain. She states that she fractured her right foot about 1 year ago and she was seen Dr. Gershon Mussel. She is placed into a boot for a period time. She states that she still has pain on daily basis with walking and ambulating and is started to travel into her ankle. She is very active and likes to walk and hike however this is been limiting her activity levels. She describes as a throbbing sensation. She does get numbness to her third and fourth toes on the right foot. No sharp pains. No other complaints at this time.  Review of Systems  All other systems reviewed and are negative.      Objective:   Physical Exam General: AAO x3, NAD  Dermatological: Skin is warm, dry and supple bilateral. Nails x 10 are well manicured; remaining integument appears unremarkable at this time. There are no open sores, no preulcerative lesions, no rash or signs of infection present.  Vascular: Dorsalis Pedis artery and Posterior Tibial artery pedal pulses are 2/4 bilateral with immedate capillary fill time. Pedal hair growth present. There is no pain with calf compression, swelling, warmth, erythema.   Neruologic: Grossly intact via light touch bilateral. Vibratory intact via tuning fork bilateral. Protective threshold with Semmes Wienstein monofilament intact to all pedal sites bilateral.  No Babinski or clonus noted bilateral.   Musculoskeletal: There is tenderness of the fifth metatarsal distally. There is no pain vibratory sensation. There is no amount edema, erythema. There is no other areas of pinpoint bony tenderness or pain the vibratory sensation. There is tenderness the third interspace the right foot. No pop all neuromas identified and there is no clicking sensation have there is reproduction of numbness and tingling to her third and fourth toes. No  pain, crepitus, or limitation noted with foot and ankle range of motion bilateral. Muscular strength 5/5 in all groups tested bilateral.  Gait: Unassisted, Nonantalgic.        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 Possible fracture fifth metatarsal with a radiolucent line still evident.  -At this time given the longevity of symptoms and she has tried multiple conservative treatments I have recommended an MRI of her foot to rule out fracture as well as neuroma and tendon pathology. -Discussed custom orthotics but we will await the MRI results prior to this. -Follow-up after MRI or sooner if any issues are to arise. Encouraged to call the questions or concerns in the meantime.  Celesta Gentile, DPM

## 2015-12-21 NOTE — Telephone Encounter (Signed)
I would go ahead and start with custom orthotics. Not sure if they are covered or not with her insurance.

## 2015-12-22 MED ORDER — NONFORMULARY OR COMPOUNDED ITEM: Status: DC

## 2015-12-22 NOTE — Telephone Encounter (Signed)
They ware wanting [place] to do more conservative treatment before the MRI. I was getting it due to the possible fracture nonhealing. She is getting this for a reason. I think that if we control her foot better with an insert, this may help. If not then we will try the MRI again.

## 2015-12-24 ENCOUNTER — Inpatient Hospital Stay: Admission: RE | Admit: 2015-12-24 | Payer: BC Managed Care – PPO | Source: Ambulatory Visit

## 2016-01-12 ENCOUNTER — Ambulatory Visit (INDEPENDENT_AMBULATORY_CARE_PROVIDER_SITE_OTHER): Payer: BC Managed Care – PPO | Admitting: Podiatry

## 2016-01-12 DIAGNOSIS — D3610 Benign neoplasm of peripheral nerves and autonomic nervous system, unspecified: Secondary | ICD-10-CM

## 2016-01-12 DIAGNOSIS — M779 Enthesopathy, unspecified: Secondary | ICD-10-CM | POA: Diagnosis not present

## 2016-01-12 DIAGNOSIS — M7740 Metatarsalgia, unspecified foot: Secondary | ICD-10-CM

## 2016-01-12 NOTE — Progress Notes (Signed)
Patient ID: [name] [name], female   DOB: Feb 12, 1971, 45 y.o.   MRN: [id number]  Patient was scanned for orthotics today. She declined be seen by myself. Any concerns today. She was seen by Johnnye Lana, CMA  Celesta Gentile, DPM

## 2016-02-06 ENCOUNTER — Ambulatory Visit: Payer: BC Managed Care – PPO | Admitting: *Deleted

## 2016-02-06 DIAGNOSIS — M779 Enthesopathy, unspecified: Secondary | ICD-10-CM

## 2016-02-06 NOTE — Progress Notes (Signed)
Patient ID: [name] [name], female   DOB: 02/02/1971, 45 y.o.   MRN: [id number] Patient presents for orthotic pick up.  Verbal and written break in and wear instructions given.  Patient will follow up in 4 weeks if symptoms worsen or fail to improve.

## 2016-02-06 NOTE — Patient Instructions (Signed)

## 2016-04-12 ENCOUNTER — Other Ambulatory Visit: Payer: Self-pay | Admitting: Family Medicine

## 2016-04-12 DIAGNOSIS — R1084 Generalized abdominal pain: Secondary | ICD-10-CM

## 2016-04-12 DIAGNOSIS — R102 Pelvic and perineal pain: Secondary | ICD-10-CM

## 2016-04-23 ENCOUNTER — Ambulatory Visit
Admission: RE | Admit: 2016-04-23 | Discharge: 2016-04-23 | Disposition: A | Discharge status: Home / Self Care | Payer: BC Managed Care – PPO | Source: Ambulatory Visit | Attending: Family Medicine | Admitting: Family Medicine

## 2016-04-23 DIAGNOSIS — R102 Pelvic and perineal pain: Secondary | ICD-10-CM

## 2016-04-23 DIAGNOSIS — R1084 Generalized abdominal pain: Secondary | ICD-10-CM

## 2016-07-01 ENCOUNTER — Other Ambulatory Visit: Payer: Self-pay | Admitting: Obstetrics and Gynecology

## 2016-07-01 DIAGNOSIS — Z1231 Encounter for screening mammogram for malignant neoplasm of breast: Secondary | ICD-10-CM

## 2016-07-03 ENCOUNTER — Other Ambulatory Visit: Payer: Self-pay | Admitting: Obstetrics and Gynecology

## 2016-07-03 DIAGNOSIS — N644 Mastodynia: Secondary | ICD-10-CM

## 2016-07-10 ENCOUNTER — Other Ambulatory Visit: Payer: BC Managed Care – PPO

## 2016-07-15 ENCOUNTER — Other Ambulatory Visit: Payer: BC Managed Care – PPO

## 2016-07-19 ENCOUNTER — Ambulatory Visit
Admission: RE | Admit: 2016-07-19 | Discharge: 2016-07-19 | Disposition: A | Discharge status: Home / Self Care | Payer: BC Managed Care – PPO | Source: Ambulatory Visit | Attending: Obstetrics and Gynecology | Admitting: Obstetrics and Gynecology

## 2016-07-19 DIAGNOSIS — N644 Mastodynia: Secondary | ICD-10-CM

## 2017-01-23 LAB — HM PAP SMEAR: HM Pap smear: NEGATIVE

## 2017-01-31 DIAGNOSIS — E038 Other specified hypothyroidism: Secondary | ICD-10-CM | POA: Insufficient documentation

## 2017-01-31 DIAGNOSIS — E063 Autoimmune thyroiditis: Secondary | ICD-10-CM | POA: Insufficient documentation

## 2017-04-30 ENCOUNTER — Other Ambulatory Visit: Payer: Self-pay | Admitting: Family Medicine

## 2017-04-30 DIAGNOSIS — N281 Cyst of kidney, acquired: Secondary | ICD-10-CM

## 2017-05-06 ENCOUNTER — Ambulatory Visit
Admission: RE | Admit: 2017-05-06 | Discharge: 2017-05-06 | Disposition: A | Discharge status: Home / Self Care | Payer: BC Managed Care – PPO | Source: Ambulatory Visit | Attending: Family Medicine | Admitting: Family Medicine

## 2017-05-06 DIAGNOSIS — N281 Cyst of kidney, acquired: Secondary | ICD-10-CM

## 2017-05-06 HISTORY — DX: Cyst of kidney, acquired: N28.1

## 2017-08-15 ENCOUNTER — Other Ambulatory Visit: Payer: Self-pay | Admitting: Obstetrics and Gynecology

## 2017-08-15 DIAGNOSIS — Z1231 Encounter for screening mammogram for malignant neoplasm of breast: Secondary | ICD-10-CM

## 2017-09-12 ENCOUNTER — Ambulatory Visit
Admission: RE | Admit: 2017-09-12 | Discharge: 2017-09-12 | Disposition: A | Discharge status: Home / Self Care | Payer: BC Managed Care – PPO | Source: Ambulatory Visit | Attending: Obstetrics and Gynecology | Admitting: Obstetrics and Gynecology

## 2017-09-12 DIAGNOSIS — Z1231 Encounter for screening mammogram for malignant neoplasm of breast: Secondary | ICD-10-CM

## 2018-02-27 ENCOUNTER — Encounter: Payer: Self-pay | Admitting: Podiatry

## 2018-02-27 ENCOUNTER — Ambulatory Visit: Payer: BC Managed Care – PPO | Admitting: Podiatry

## 2018-02-27 ENCOUNTER — Ambulatory Visit (INDEPENDENT_AMBULATORY_CARE_PROVIDER_SITE_OTHER): Payer: BC Managed Care – PPO

## 2018-02-27 DIAGNOSIS — M7671 Peroneal tendinitis, right leg: Secondary | ICD-10-CM

## 2018-02-27 DIAGNOSIS — S99921A Unspecified injury of right foot, initial encounter: Secondary | ICD-10-CM

## 2018-02-27 DIAGNOSIS — M7741 Metatarsalgia, right foot: Secondary | ICD-10-CM

## 2018-02-27 DIAGNOSIS — N939 Abnormal uterine and vaginal bleeding, unspecified: Secondary | ICD-10-CM | POA: Insufficient documentation

## 2018-02-27 MED ORDER — MELOXICAM 15 MG PO TABS: 15.0000 mg | ORAL_TABLET | Freq: Every day | ORAL | 0 refills | Status: DC

## 2018-02-27 NOTE — Patient Instructions (Signed)
Peroneal Tendinopathy Rehab  Ask your health care provider which exercises are safe for you. Do exercises exactly as told by your health care provider and adjust them as directed. It is normal to feel mild stretching, pulling, tightness, or discomfort as you do these exercises, but you should stop right away if you feel sudden pain or your pain gets worse.Do not begin these exercises until told by your health care provider.  Stretching and range of motion exercises  These exercises warm up your muscles and joints and improve the movement and flexibility of your ankle. These exercises also help to relieve pain and stiffness.  Exercise A: Gastroc and soleus, standing  1. Stand on the edge of a step on the balls of your feet. The ball of your foot is on the walking surface, right under your toes.  2. Hold onto the railing for balance.  3. Slowly lift your left / right foot, allowing your body weight to press your left / right heel down over the edge of the step. You should feel a stretch in your left / right calf.  4. Hold this position for __________ seconds.  Repeat __________ times with your left / right knee straight and __________ times with your left / right knee bent. Complete this stretch __________ times per day.  Strengthening exercises  These exercises improve the strength and endurance of your foot and ankle. Endurance is the ability to use your muscles for a long time, even after they get tired.  Exercise B: Dorsiflexors    1. Secure a rubber exercise band or tube to an object, like a table leg, that will not move if it is pulled on.  2. Secure the other end of the band around your left / right foot.  3. Sit on the floor, facing the object with your left / right foot extended. The band or tube should be slightly tense when your foot is relaxed.  4. Slowly flex your left / right ankle and toes to bring your foot toward you.  5. Hold this position for __________ seconds.  6. Slowly return your foot to the  starting position.  Repeat __________ times. Complete this exercise __________ times per day.  Exercise C: Evertors  1. Sit on the floor with your legs straight out in front of you.  2. Loop a rubber exercise or band or tube around the ball of your left / right foot. The ball of your foot is on the walking surface, right under your toes.  3. Hold the ends of the band in your hands, or secure the band to a stable object.  4. Slowly push your foot outward, away from your other leg.  5. Hold this position for __________ seconds.  6. Slowly return your foot to the starting position.  Repeat __________ times. Complete this exercise __________ times per day.  Exercise D: Standing heel raise (  plantar flexion)  1. Stand with your feet shoulder-width apart with the balls of your feet on a step. The ball of your foot is on the walking surface, right under your toes.  2. Keep your weight spread evenly over the width of your feet while you rise up on your toes. Use a wall or railing to steady yourself, but try not to use it for support.  3. If this exercise is too easy, try these options:  ? Shift your weight toward your left / right leg until you feel challenged.  ? If told by   your health care provider, stand on your left / right leg only.  4. Hold this position for __________ seconds.  Repeat __________ times. Complete this exercise __________ times per day.  Exercise E: Single leg stand  1. Without shoes, stand near a railing or in a doorway. You may hold onto the railing or door frame as needed.  2. Stand on your left / right foot. Keep your big toe down on the floor and try to keep your arch lifted.  ? Do not roll to the outside of your foot.  ? If this exercise is too easy, you can try it with your eyes closed or while standing on a pillow.  3. Hold this position for __________ seconds.  Repeat __________ times. Complete this exercise __________ times per day.  This information is not intended to replace advice given to  you by your health care provider. Make sure you discuss any questions you have with your health care provider.  Document Released: 06/10/2005 Document Revised: 02/15/2016 Document Reviewed: 04/29/2015  Elsevier Interactive Patient Education  2018 [employer]vier [employer]

## 2018-02-28 NOTE — Progress Notes (Signed)
Subjective: 47 year old female presents the office today for evaluation of right foot pain.  She states a couple years ago when I last saw her she had an old fracture of the right foot and she has had some discomfort on and off since I last saw her but more recently she had some decrease in discomfort to the area.  She typically walks 4 to 5 miles per day.  She denies any recent injury or trauma she denies any swelling.  The pain does not wake her up at night.  She does state that the pain does not move at times to different areas of her foot.  She does wear orthotics that he got from her but not all the time. Denies any systemic complaints such as fevers, chills, nausea, vomiting. No acute changes since last appointment, and no other complaints at this time.   Objective: AAO x3, NAD DP/PT pulses palpable bilaterally, CRT less than 3 seconds At this time there is no area pinpoint tenderness.  Subjectively there is tenderness along the metatarsal heads dorsally as well as mildly to the insertion of the peroneal tendon except metatarsal base but there is no area of tenderness elicited today there is no pinpoint tenderness.  No pain to vibratory sensation.  No overlying edema, erythema, increase in warmth.  Occasionally she also states that she gets some pain into the toes at times but currently not experiencing this. No open lesions or pre-ulcerative lesions.  No pain with calf compression, swelling, warmth, erythema  Assessment: Right foot pain, tendinitis  Plan: -All treatment options discussed with the patient including all alternatives, risks, complications.  -X-rays were obtained and reviewed.  Unable to identify any evidence of acute fracture or stress fracture today. -Orthotics appear to be fitting well but she is interested in getting another pair.  I will have her follow-up with Liliane Channel for this. -Prescribed mobic. Discussed side effects of the medication and directed to stop if any are to occur  and call the office.  -Discussed some general stretching, rehab exercises for her foot.  Also ice the area daily.  Gradually increasing her activity and her distance of walking.  There is any worsening to me know if does not resolve the next 4 weeks. -Patient encouraged to call the office with any questions, concerns, change in symptoms.   Trula Slade DPM

## 2018-03-03 LAB — TSH: TSH: 1.75 (ref <=5.90)

## 2018-03-09 ENCOUNTER — Other Ambulatory Visit: Payer: BC Managed Care – PPO | Admitting: Orthotics

## 2018-08-25 ENCOUNTER — Ambulatory Visit: Payer: BC Managed Care – PPO | Admitting: Podiatry

## 2018-08-25 ENCOUNTER — Other Ambulatory Visit: Payer: Self-pay | Admitting: Podiatry

## 2018-08-25 ENCOUNTER — Ambulatory Visit (INDEPENDENT_AMBULATORY_CARE_PROVIDER_SITE_OTHER): Payer: BC Managed Care – PPO

## 2018-08-25 DIAGNOSIS — S92401A Displaced unspecified fracture of right great toe, initial encounter for closed fracture: Secondary | ICD-10-CM

## 2018-08-25 DIAGNOSIS — M79671 Pain in right foot: Secondary | ICD-10-CM

## 2018-08-25 DIAGNOSIS — B351 Tinea unguium: Secondary | ICD-10-CM | POA: Diagnosis not present

## 2018-08-25 MED ORDER — EFINACONAZOLE 10 % EX SOLN: 1.0000 [drp] | Freq: Every day | CUTANEOUS | 11 refills | Status: DC

## 2018-08-25 NOTE — Patient Instructions (Signed)
Toe Fracture  A toe fracture is a break in one of the toe bones (phalanges). This may happen if you:  · Drop a heavy object on your toe.  · Stub your toe.  · Twist your toe.  · Exercise the same way too much.  What are the signs or symptoms?  The main symptoms are swelling and pain in the toe. You may also have:  · Bruising.  · Stiffness.  · Numbness.  · A change in the way the toe looks.  · Broken bones that poke through the skin.  · Blood under the toenail.  How is this treated?  Treatments may include:  · Taping the broken toe to a toe that is next to it (buddy taping).  · Wearing a shoe that has a wide, rigid sole to protect the toe and to limit its movement.  · Wearing a cast.  · Surgery. This may be needed if the:  ? Pieces of broken bone are out of place.  ? Bone pokes through the skin.  · Physical therapy.  Follow these instructions at home:  If you have a shoe:  · Wear the shoe as told by your doctor. Remove it only as told by your doctor.  · Loosen the shoe if your toes tingle, become numb, or turn cold and blue.  · Keep the shoe clean and dry.  If you have a cast:  · Do not put pressure on any part of the cast until it is fully hardened. This may take a few hours.  · Do not stick anything inside the cast to scratch your skin.  · Check the skin around the cast every day. Tell your doctor about any concerns.  · You may put lotion on dry skin around the edges of the cast.  · Do not put lotion on the skin under the cast.  · Keep the cast clean and dry.  Bathing  · Do not take baths, swim, or use a hot tub until your doctor says it is okay. Ask your doctor if you can take showers.  · If the shoe or cast is not waterproof:  ? Do not let it get wet.  ? Cover it with a watertight covering when you take a bath or a shower.  Activity  · Do not use your foot to support your body weight until your doctor says it is okay.  · Use crutches as told by your doctor.  · Ask your doctor what activities are safe for you  during recovery.  · Avoid activities as told by your doctor.  · Do exercises as told by your doctor or therapist.  Driving  · Do not drive or use heavy machinery while taking pain medicine.  · Do not drive while wearing a cast on a foot that you use for driving.  Managing pain, stiffness, and swelling    · Put ice on the injured area if told by your doctor:  ? Put ice in a plastic bag.  ? Place a towel between your skin and the bag.  § If you have a shoe, remove it as told by your doctor.  § If you have a cast, place a towel between your cast and the bag.  ? Leave the ice on for 20 minutes, 2-3 times per day.  · Raise (elevate) the injured area above the level of your heart while you are sitting or lying down.  General instructions  · If your   toe was taped to a toe that is next to it, follow your doctor's instructions for changing the gauze and tape. Change it more often:  ? If the gauze and tape get wet. If this happens, dry the space between the toes.  ? If the gauze and tape are too tight and they cause your toe to become pale or to lose feeling (go numb).  · If your doctor did not give you a protective shoe, wear sturdy shoes that support your foot. Your shoes should not:  ? Pinch your toes.  ? Fit tightly against your toes.  · Do not use any tobacco products, including cigarettes, chewing tobacco, or e-cigarettes. These can delay bone healing. If you need help quitting, ask your doctor.  · Take medicines only as told by your doctor.  · Keep all follow-up visits as told by your doctor. This is important.  Contact a doctor if:  · Your pain medicine is not helping.  · You have a fever.  · You notice a bad smell coming from your cast.  Get help right away if:  · You lose feeling (have numbness) in your toe or foot, and it is getting worse.  · Your toe or your foot tingles.  · Your toe or your foot gets cold or turns blue.  · You have redness or swelling in your toe or foot, and it is getting worse.  · You have very  bad pain.  Summary  · A toe fracture is a break in one of the toe bones.  · Use ice and raise your foot. This will help lessen pain and swelling.  · Use crutches as told by your doctor.  This information is not intended to replace advice given to you by your health care provider. Make sure you discuss any questions you have with your health care provider.  Document Released: 11/27/2007 Document Revised: 07/22/2017 Document Reviewed: 07/22/2017  Elsevier Interactive Patient Education © 2019 [employer]vier Inc.

## 2018-08-25 NOTE — Progress Notes (Signed)
Subjective: 48 year old female presents the office today for concerns her right fifth toe pain after she dropped with 2 x 4 of her right foot about 5 weeks ago.  She states that she had swelling and pain just continued pain in the last couple weeks and she wants to have the area checked this is not getting much better.  Denies any open sores any other injury.  Is also interested in toenail fungus treatments.  We have done a topical compound cream without significant improvement and she will discuss other options.  Denies any pain in the nails and denies any redness or drainage or any swelling. Denies any systemic complaints such as fevers, chills, nausea, vomiting. No acute changes since last appointment, and no other complaints at this time.   Objective: AAO x3, NAD DP/PT pulses palpable bilaterally, CRT less than 3 seconds Nails are mildly hypertrophic, dystrophic with some yellow and white discoloration.  No pitting present.  No edema, erythema, drainage or pus or any clinical signs of infection noted today. There is edema to the right fifth toe, the toes and rectus position.  Minimal pain to the toe.  No open sores no erythema. No open lesions or pre-ulcerative lesions.  No pain with calf compression, swelling, warmth, erythema  Assessment: Right fifth toe fracture, onychomycosis  Plan: -All treatment options discussed with the patient including all alternatives, risks, complications.  -X-rays obtained reviewed.  Fracture of the distal phalanx there is some evidence of healing across the fracture site but a radiolucency is still evident. -Regard to the toe we discussed that the tape the toe to help with swelling.  Discussed wearing wider shoes.  Discussed timeframe for toe fractures to heal. -Prescribe Jublia for nail fungus.  Discussed side effects and success rates.  I ordered this through specialty pharmacy.  If any issues or concerns let me know. -Patient encouraged to call the office with  any questions, concerns, change in symptoms.   Trula Slade DPM

## 2018-09-01 DIAGNOSIS — S92401A Displaced unspecified fracture of right great toe, initial encounter for closed fracture: Secondary | ICD-10-CM | POA: Insufficient documentation

## 2018-09-01 DIAGNOSIS — B351 Tinea unguium: Secondary | ICD-10-CM | POA: Insufficient documentation

## 2018-09-01 HISTORY — DX: Displaced unspecified fracture of right great toe, initial encounter for closed fracture: S92.401A

## 2019-01-22 ENCOUNTER — Telehealth: Payer: Self-pay

## 2019-01-22 NOTE — Telephone Encounter (Signed)
No answer when called to go over covid 19 screening questions

## 2019-01-23 NOTE — Progress Notes (Signed)
Cardiology Office Note   Date:  01/25/2019   ID:  [name], DOB September 17, 1970, MRN [id number]  PCP:  Maurice Small, MD  Cardiologist:   Dorris Carnes, MD   Pt is a 48 yo who is referred dby Dr [name] [name] for CP      History of Present Illness: [name] [name] is a 48 y.o. [name] with no known CAD   Followed by Dr [name] [name]    [name] [name] has a hx of hypothyroidism, IBS, fibromyalgia   [name] [name] was seen in march for CP   EKG showed T wave inversion in anterior leads  (v1, V2)   rSR' in V1/V2  No previous   Note in March 2010 [name] [name] had a CT coronary angiogram after having left sided CP afterd delivery   Ca score was 0 and CT angiogram was normal    Patient says [name] [name] has always had issues with heart but nothing found when evaluated in the past ([place] and here)  In 1990s had epiosde of heart racing in which [name] [name]  passed out  [name] [name] was  Playing with dog  In house   Felt oK prior    Then felt her heart racing and then dzzy  Went to set down, passed out   Doesn't think [name] [name] was out long     In 2003 [name] [name] had a syncopal spell while n [place]   Was sitting in bed and felt heart racing  Blacked out   Around 11 PM    Flushed after  about 11 yers ago   In bed reading a book   Same thin as last bed spell  Besides dizzines, the pt also complains of hcest pressure  Left chest Aches and hurts   Begain in November 2019     On and off   No problems with working out   No pleuritic    The patient also complains of dizziness   [name] [name] has always been a little dizzines  Now it is worse  Cant mop floor   Claims [name] [name] is drinking a lot     Walks about 150 miles per week , 5 to 8 miles perr day     Drinks water all the time   Does not add salt to food    Current Meds  Medication Sig  . Calcium Carbonate-Vitamin D (CALCIUM 600+D) 600-400 MG-UNIT per tablet Take 1 tablet by mouth every morning.   Marland Kitchen ibuprofen (ADVIL,MOTRIN) 200 MG tablet Take 200 mg by mouth every 6 (six) hours as needed. For headache  . LEVOTHYROXINE SODIUM PO Take  240 mcg by mouth as directed. Patient takes slow release capsule  . Multiple Vitamin (MULITIVITAMIN WITH MINERALS) TABS Take 1 tablet by mouth every morning.      Allergies:   Patient has no known allergies.   Past Medical History:  Diagnosis Date  . Hypothyroidism   . PONV (postoperative nausea and vomiting)     Past Surgical History:  Procedure Laterality Date  . COLONOSCOPY    . DILATION AND CURETTAGE OF UTERUS  2006   missed ab  . DILATION AND EVACUATION  08/14/2011   Procedure: DILATATION AND EVACUATION;  Surgeon: Logan Bores, MD;  Location: [facility];  Service: Gynecology;  Laterality: N/A;     Social History:  The patient  reports that [name] [name] has never smoked. [name] [name] has never used smokeless tobacco. [name] [name] reports that [name] [name] does not drink alcohol or use drugs.   Family History:  The patient's  family history includes Hypertension in her father and mother.    ROS:  Please see the history of present illness. All other systems are reviewed and  Negative to the above problem except as noted.    PHYSICAL EXAM: VS:  BP 103/72   Pulse 92   Ht 5\' 5"  (1.651 m)   Wt 132 lb (59.9 kg)   BMI 21.97 kg/m   GEN: Well nourished, well developed, in no acute distress  HEENT: normal  Neck: no JVD, carotid bruits, or masses Cardiac: RRR; no murmurs, rubs, or gallops,no edema  Respiratory:  clear to auscultation bilaterally, normal work of breathing GI: soft, nontender, nondistended, + BS  No hepatomegaly  MS: no deformity Moving all extremities   Skin: warm and dry, no rash Neuro:  Strength and sensation are intact Psych: euthymic mood, full affect   EKG:  EKG is not ordered  ordered today.  As above     Lipid Panel No results found for: CHOL, TRIG, HDL, CHOLHDL, VLDL, LDLCALC, LDLDIRECT    Wt Readings from Last 3 Encounters:  01/25/19 132 lb (59.9 kg)  08/14/11 117 lb (53.1 kg)      ASSESSMENT AND PLAN:  1  1  Dizziness  / syncope The pt has evid of POTS on orthostatic  check  HR went from 72 to 103 with standing   This explains some of her symptoms ,  Possibly all  Will check labs today   I disccussed increased fluid and saltintakwe (5 to 6 L, 4-6 g NA)  Also, frequent small meals, SPANX, elevate head of bead    Spells of syncope are concerning    Infrequent   EKG without clues    I would follow   Palpitatiosn are short lived prior  Rare  Encouraged her to watch for prodrome and sit    Try to capture on Apple watcht  2  Chest pressure   I do not thiink these represent angina    May be like a lactic acidosis in setting of POTS   Follow    F/U in 8 wks    Current medicines are reviewed at length with the patient today.  The patient does not have concerns regarding medicines.  Signed, Dorris Carnes, MD  01/25/2019 [facility] [address], [place], [place]  [address] Phone: [phone]; Fax: [phone]

## 2019-01-25 ENCOUNTER — Ambulatory Visit: Payer: BC Managed Care – PPO | Admitting: Internal Medicine

## 2019-01-25 ENCOUNTER — Encounter: Payer: Self-pay | Admitting: Internal Medicine

## 2019-01-25 ENCOUNTER — Other Ambulatory Visit: Payer: Self-pay

## 2019-01-25 ENCOUNTER — Encounter (INDEPENDENT_AMBULATORY_CARE_PROVIDER_SITE_OTHER): Payer: Self-pay

## 2019-01-25 VITALS — BP 103/72 | HR 92 | Ht 65.0 in | Wt 132.0 lb

## 2019-01-25 DIAGNOSIS — R079 Chest pain, unspecified: Secondary | ICD-10-CM

## 2019-01-25 DIAGNOSIS — E038 Other specified hypothyroidism: Secondary | ICD-10-CM

## 2019-01-25 DIAGNOSIS — E063 Autoimmune thyroiditis: Secondary | ICD-10-CM | POA: Diagnosis not present

## 2019-01-25 LAB — CBC
Hematocrit: 38.4 % (ref 34.0–46.6)
Hemoglobin: 13.1 g/dL (ref 11.1–15.9)
MCH: 30.5 pg (ref 26.6–33.0)
MCHC: 34.1 g/dL (ref 31.5–35.7)
MCV: 89 fL (ref 79–97)
Platelets: 233 10*3/uL (ref 150–450)
RBC: 4.3 x10E6/uL (ref 3.77–5.28)
RDW: 12.1 % (ref 11.7–15.4)
WBC: 5.3 10*3/uL (ref 3.4–10.8)

## 2019-01-25 LAB — BASIC METABOLIC PANEL
BUN/Creatinine Ratio: 10 (ref 9–23)
BUN: 7 mg/dL (ref 6–24)
CO2: 22 mmol/L (ref 20–29)
Calcium: 9.7 mg/dL (ref 8.7–10.2)
Chloride: 106 mmol/L (ref 96–106)
Creatinine, Ser: 0.73 mg/dL (ref 0.57–1.00)
GFR calc Af Amer: 111 mL/min/{1.73_m2} (ref >59)
GFR calc non Af Amer: 96 mL/min/{1.73_m2} (ref >59)
Glucose: 89 mg/dL (ref 65–99)
Potassium: 4 mmol/L (ref 3.5–5.2)
Sodium: 143 mmol/L (ref 134–144)

## 2019-01-25 LAB — TSH: TSH: 0.007 u[IU]/mL — ABNORMAL LOW (ref 0.450–4.500)

## 2019-01-25 NOTE — Patient Instructions (Signed)
Medication Instructions:  Your physician recommends that you continue on your current medications as directed. Please refer to the Current Medication list given to you today.   If you need a refill on your cardiac medications before your next appointment, please call your pharmacy.   Lab work: BMET, CBC and TSH today  If you have labs (blood work) drawn today and your tests are completely normal, you will receive your results only by: Marland Kitchen MyChart Message (if you have MyChart) OR . A paper copy in the mail If you have any lab test that is abnormal or we need to change your treatment, we will call you to review the results.  Testing/Procedures: None  Follow-Up: At [facility], you and your health needs are our priority.  As part of our continuing mission to provide you with exceptional heart care, we have created designated Provider Care Teams.  These Care Teams include your primary Cardiologist (physician) and Advanced Practice Providers (APPs -  Physician Assistants and Nurse Practitioners) who all work together to provide you with the care you need, when you need it. You will need a follow up appointment in:  8 weeks (Ok to use 03/25/2019 at 1:40pm) .  Please call our office 2 months in advance to schedule this appointment.  You may see Dr. Harrington Challenger or one of the following Advanced Practice Providers on your designated Care Team: Richardson Dopp, PA-C [place], [place] . Daune Perch, NP  Any Other Special Instructions Will Be Listed Below (If Applicable).

## 2019-03-25 ENCOUNTER — Ambulatory Visit: Payer: BC Managed Care – PPO | Admitting: Internal Medicine

## 2019-05-26 LAB — TSH: TSH: 0.12 — AB (ref <=5.90)

## 2019-08-20 ENCOUNTER — Ambulatory Visit: Payer: BC Managed Care – PPO | Admitting: Family Medicine

## 2019-08-20 ENCOUNTER — Other Ambulatory Visit: Payer: Self-pay

## 2019-08-20 ENCOUNTER — Encounter: Payer: Self-pay | Admitting: Family Medicine

## 2019-08-20 VITALS — BP 104/74 | HR 103 | Temp 98.0°F | Resp 17 | Ht 66.0 in | Wt 134.1 lb

## 2019-08-20 DIAGNOSIS — Z0000 Encounter for general adult medical examination without abnormal findings: Secondary | ICD-10-CM

## 2019-08-20 DIAGNOSIS — G43809 Other migraine, not intractable, without status migrainosus: Secondary | ICD-10-CM

## 2019-08-20 DIAGNOSIS — Z8249 Family history of ischemic heart disease and other diseases of the circulatory system: Secondary | ICD-10-CM

## 2019-08-20 DIAGNOSIS — Z789 Other specified health status: Secondary | ICD-10-CM

## 2019-08-20 DIAGNOSIS — K589 Irritable bowel syndrome without diarrhea: Secondary | ICD-10-CM | POA: Diagnosis not present

## 2019-08-20 DIAGNOSIS — E038 Other specified hypothyroidism: Secondary | ICD-10-CM

## 2019-08-20 DIAGNOSIS — E063 Autoimmune thyroiditis: Secondary | ICD-10-CM

## 2019-08-20 MED ORDER — ZOLMITRIPTAN 5 MG PO TABS: 5.0000 mg | ORAL_TABLET | ORAL | 5 refills | Status: DC | PRN

## 2019-08-20 MED ORDER — HYDROXYZINE HCL 10 MG PO TABS: 10.0000 mg | ORAL_TABLET | Freq: Every day | ORAL | 0 refills | Status: DC

## 2019-08-20 NOTE — Progress Notes (Signed)
Patient ID: [name] [name], female  DOB: [birth year], 49 y.o.   MRN: [id number] Patient Care Team    Relationship Specialty Notifications Start End  [name], DO PCP - General Family Medicine  08/23/19   Fay Records, MD Consulting Physician Cardiology  08/23/19   Arta Silence, MD Consulting Physician Gastroenterology  08/23/19   Otelia Sergeant, OD Referring Physician   08/23/19   Jenelle Mages, MD Referring Physician Endocrinology  08/23/19     Chief Complaint  Patient presents with  . Establish Care    Abd pain off and on for many years. Mammogram 2019. Pap smear 11/2018.     Subjective:  [name] [name] is a 49 y.o.  female present for new patient establishment. All past medical history, surgical history, allergies, family history, immunizations, medications and social history were updated in the electronic medical record today. All recent labs, ED visits and hospitalizations within the last year were reviewed.  migraine without status migrainosus, not intractable Patient reports she has a chronic migraine history.  Over the years she typically averages about 3 migraines a month.  However over the last month she reports having approximately 12 migraines.  She thinks it is sleep-related, she has not been sleeping very well.  She states she can fall asleep okay but cannot seem to stay asleep.  She wakes 1-2 times a night and has difficulty falling back asleep.  She uses Zomig as needed for her migraines.  Irritable bowel syndrome, unspecified type Patient reports a long history of abdominal discomfort and IBS-C symptoms.  She reports she continues to have chronic issues and understands now this is a "bacterial overgrowth "problem and that she may be struggling with this for the rest of her life.  She had a colonoscopy in 2008 by Dr. Maurene Capes for her constipation.  She had an additional colonoscopy in 2016 which she reports was good.  She has an upcoming appointment on  March 10 with her gastroenterologist.  She reports she eats an 80% plant-based diet.  She tries to avoid carbs since that makes her symptoms worse.  She states that her symptoms are bloating and pain in her lower abdomen with constipation.  By EMR review she has been treated for  at least once with rifaximin.  She is currently not using any stool softeners.  She reports she does take an adequate water consumption daily.  Hypothyroidism due to Hashimoto's thyroiditis Patient is followed by endocrinology for her condition.  She has been tried on many medications and doses and had a chronic fatigue symptoms.  Her current endocrinologist has her taking a compound medication of levothyroxine 220 mcg and liothyronine 18 mcg daily.  He is going to continue to follow with her endocrinologist for this condition.  Patient's last menstrual period was 07/31/2019 (exact date).   Depression screen [facility] 2/9 08/20/2019  Decreased Interest 0  Down, Depressed, Hopeless 0  PHQ - 2 Score 0   No flowsheet data found.      No flowsheet data found.  Immunization History  Administered Date(s) Administered  . Influenza Inj Mdck Quad Pf 02/15/2018  . Influenza-Unspecified 02/23/2019  . Rho (D) Immune Globulin 08/14/2011  . Tdap 04/03/2018  . Typhoid Live 04/06/2018    No exam data present  Past Medical History:  Diagnosis Date  . Chicken pox   . Closed displaced fracture of right great toe 09/01/2018  . Frequent headaches   . Hashimoto's disease   .  Hypothyroidism   . Migraines   . PONV (postoperative nausea and vomiting)   . Seasonal allergies    Allergies  Allergen Reactions  . Thimerosal Hives  . Pneumococcal Vaccine Hives, Itching, Nausea Only and Rash   Past Surgical History:  Procedure Laterality Date  . COLONOSCOPY    . DILATION AND CURETTAGE OF UTERUS  2006   missed ab  . DILATION AND EVACUATION  08/14/2011   Procedure: DILATATION AND EVACUATION;  Surgeon: Logan Bores, MD;   Location: [facility];  Service: Gynecology;  Laterality: N/A;  . WISDOM TOOTH EXTRACTION     Family History  Problem Relation Age of Onset  . Hypertension Mother   . Arthritis Mother   . Depression Mother   . Diabetes Mother   . Hyperlipidemia Mother   . Mental illness Mother   . Hypertension Father   . Early death Father   . Anuerysm Father   . Depression Brother   . Mental illness Brother   . Asthma Maternal Grandmother   . Depression Maternal Grandmother   . Heart disease Maternal Grandmother   . Depression Paternal Grandfather   . Asthma Daughter    Social History   Social History Narrative   Marital status/children/pets: Married.  5 children.   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   Safety:      -smoke alarm in the home:Yes     - wears seatbelt: Yes     - Feels safe in their relationships: Yes       Allergies as of 08/20/2019      Reactions   Thimerosal Hives   Pneumococcal Vaccine Hives, Itching, Nausea Only, Rash      Medication List       Accurate as of August 20, 2019 11:59 PM. If you have any questions, ask your nurse or doctor.        STOP taking these medications   Calcium 600+D 600-400 MG-UNIT tablet Generic drug: Calcium Carbonate-Vitamin D Stopped by: [name], DO   Flucelvax Quadrivalent 0.5 ML injection Generic drug: influenza vaccine Stopped by: [name], DO   ibuprofen 200 MG tablet Commonly known as: ADVIL Stopped by: [name], DO     TAKE these medications   Aloe Vera 25 MG Caps Take 4 capsules by mouth daily.   aspirin EC 81 MG tablet Take 81 mg by mouth as needed.   hydrOXYzine 10 MG tablet Commonly known as: ATARAX/VISTARIL Take 1-5 tablets (10-50 mg total) by mouth at bedtime. Started by: [name], DO   LEVOTHYROXINE SODIUM PO Take 220 mcg by mouth as directed. Patient takes slow release capsule   Magnesium 400 MG Caps Take by mouth. 2 Capsules daily   Melatonin 10 MG  Caps Take by mouth.   multivitamin with minerals Tabs tablet Take 1 tablet by mouth every morning.   Oil of Oregano 1500 MG Caps Take by mouth.   OVER THE COUNTER MEDICATION Candi Bactin   PRESCRIPTION MEDICATION Take 1 capsule by mouth daily. Levothyroxine 213mcg, Liothyronine 12mcg Slow Release Capsule (40)   saccharomyces boulardii 250 MG capsule Commonly known as: FLORASTOR Take 250 mg by mouth 2 (two) times daily.   selenium 50 MCG Tabs tablet Take 50 mcg by mouth daily.   zolmitriptan 5 MG tablet Commonly known as: ZOMIG Take 1 tablet (5 mg total) by mouth as needed.       All past medical history, surgical history, allergies, family history, immunizations andmedications were updated  in the EMR today and reviewed under the history and medication portions of their EMR.    No results found for this or any previous visit (from the past 2160 hour(s)).  MM SCREENING BREAST TOMO BILATERAL  Result Date: 09/12/2017 CLINICAL DATA:  Screening. EXAM: DIGITAL SCREENING BILATERAL MAMMOGRAM WITH TOMO AND CAD COMPARISON:  Previous exam(s). ACR Breast Density Category c: The breast tissue is heterogeneously dense, which may obscure small masses. FINDINGS: There are no findings suspicious for malignancy. Images were processed with CAD. IMPRESSION: No mammographic evidence of malignancy. A result letter of this screening mammogram will be mailed directly to the patient. RECOMMENDATION: Screening mammogram in one year. (Code:SM-B-01Y) BI-RADS CATEGORY  1: Negative. Electronically Signed   By: Dorise Bullion III M.D   On: 09/12/2017 15:51     ROS: 14 pt review of systems performed and negative (unless mentioned in an HPI)  Objective: BP 104/74 (BP Location: Left Arm, Patient Position: Sitting, Cuff Size: Normal)   Pulse (!) 103   Temp 98 F (36.7 C) (Temporal)   Resp 17   Ht 5\' 6"  (1.676 m)   Wt 134 lb 2 oz (60.8 kg)   LMP 07/31/2019 (Exact Date)   SpO2 97%   BMI 21.65 kg/m   Gen: Afebrile. No acute distress. Nontoxic in appearance, well-developed, well-nourished, very pleasant Caucasian female. HENT: AT. [place].  No cough.  No shortness of breath.  No hoarseness. Eyes:Pupils Equal Round Reactive to light, Extraocular movements intact,  Conjunctiva without redness, discharge or icterus. Neck/lymp/endocrine: Supple, no lymphadenopathy, no thyromegaly CV: RRR no murmur, no edema Chest: CTAB, no wheeze, rhonchi or crackles.  Abd: Soft. NTND. BS present. . Skin: No rashes, purpura or petechiae. Warm and well-perfused. Skin intact. Neuro/Msk:  Normal gait. PERLA. EOMi. Alert. Oriented x3.   Psych: Normal affect, dress and demeanor. Normal speech. Normal thought content and judgment.   Assessment/plan: GWYN BUSCHMANN is a 50 y.o. female present for est care migraine without status migrainosus, not intractable/vegetarian/sleep disturbance/family history of brain aneurysm in her father Encourage patient to start B12 over-the-counter supplementation and magnesium. Continue Zomig Vistaril prescribed today for tapering to help both with her headaches and sleep disturbance.  Considered amitriptyline but with her chronic constipation avoided due to potential worsening of her constipation. Family history of brain aneurysm in her father, low threshold of imaging if not done prior. Follow-up 4 weeks  Irritable bowel syndrome, unspecified type -Has had significant work-up with GI -Constipation predominant.  Encouraged her to use the MiraLAX taper daily.  Hypothyroidism due to Hashimoto's thyroiditis Follows with endocrinology.  We will continue to follow with endocrinology.  Currently uses a compounded medication.   Return in about 4 weeks (around 09/17/2019).  No orders of the defined types were placed in this encounter.  Meds ordered this encounter  Medications  . hydrOXYzine (ATARAX/VISTARIL) 10 MG tablet    Sig: Take 1-5 tablets (10-50 mg total) by mouth at  bedtime.    Dispense:  120 tablet    Refill:  0  . zolmitriptan (ZOMIG) 5 MG tablet    Sig: Take 1 tablet (5 mg total) by mouth as needed.    Dispense:  10 tablet    Refill:  5    Hold until pt request   Referral Orders  No referral(s) requested today     Note is dictated utilizing voice recognition software. Although note has been proof read prior to signing, occasional typographical errors still can be missed. If any questions  arise, please do not hesitate to call for verification.  Electronically signed by: [name], DO [place]

## 2019-08-20 NOTE — Patient Instructions (Addendum)
Start vistaril 1 tab nightly for 3 nights, taper up by 1 tab nightly every 3 nights until you are sleeping well, but no "sleep hangover". Max dose 50 mg QHS.  Follow up in 1 month.   Taper Miralax and use daily to keep stools formed but soft.    Please help [place] help you:  We are honored you have chosen [facility] for your Primary Care home. Below you will find basic instructions that you may need to access in the future. Please help [place] help you by reading the instructions, which cover many of the frequent questions we experience.   Prescription refills and request:  -In order to allow more efficient response time, please call your pharmacy for all refills. They will forward the request electronically to [place]. This allows for the quickest possible response. Request left on a nurse line can take longer to refill, since these are checked as time allows between office patients and other phone calls.  - refill request can take up to 3-5 working days to complete.  - If request is sent electronically and request is appropiate, it is usually completed in 1-2 business days.  - all patients will need to be seen routinely for all chronic medical conditions requiring prescription medications (see follow-up below). If you are overdue for follow up on your condition, you will be asked to make an appointment and we will call in enough medication to cover you until your appointment (up to 30 days).  - all controlled substances will require a face to face visit to request/refill.  - if you desire your prescriptions to go through a new pharmacy, and have an active script at original pharmacy, you will need to call your pharmacy and have scripts transferred to new pharmacy. This is completed between the pharmacy locations and not by your provider.    Results: Our office handles many outgoing and incoming calls daily. If we have not contacted you within 1 week about your results, please check your mychart to see  if there is a message first and if not, then contact our office.  In helping with this matter, you help decrease call volume, and therefore allow [place] to be able to respond to patients needs more efficiently.  We will always attempt to call you with results,  normal or abnormal. However, if we are unable to reach you we will send a message in your my chart with results.   Acute office visits (sick visit):  An acute visit is intended for a new problem and are scheduled in shorter time slots to allow schedule openings for patients with new problems. This is the appropriate visit to discuss a new problem. Problems will not be addressed by phone call or Echart message. Appointment is needed if requesting treatment. In order to provide you with excellent quality medical care with proper time for you to explain your problem, have an exam and receive treatment with instructions, these appointments should be limited to one new problem per visit. If you experience a new problem, in which you desire to be addressed, please make an acute office visit, we save openings on the schedule to accommodate you. Please do not save your new problem for any other type of visit, let us take care of it properly and quickly for you.   Follow up visits:  Depending on your condition(s) your provider will need to see you routinely in order to provide you with quality care and prescribe medication(s). Most  chronic conditions (Example: hypertension, Diabetes, depression/anxiety... etc), require visits a couple times a year. Your provider will instruct you on proper follow up for your personal medical conditions and history. Please make certain to make follow up appointments for your condition as instructed. Failing to do so could result in lapse in your medication treatment/refills. If you request a refill, and are overdue to be seen on a condition, we will always provide you with a 30 day script (once) to allow you time to schedule.     Medicare wellness (well visit): - we have a wonderful Nurse Maudie Mercury), that will meet with you and provide you will yearly medicare wellness visits. These visits should occur yearly (can not be scheduled less than 1 calendar year apart) and cover preventive health, immunizations, advance directives and screenings you are entitled to yearly through your medicare benefits. Do not miss out on your entitled benefits, this is when medicare will pay for these benefits to be ordered for you.  These are strongly encouraged by your provider and is the appropriate type of visit to make certain you are up to date with all preventive health benefits. If you have not had your medicare wellness exam in the last 12 months, please make certain to schedule one by calling the office and schedule your medicare wellness with Maudie Mercury as soon as possible.   Yearly physical (well visit):  - Adults are recommended to be seen yearly for physicals. Check with your insurance and date of your last physical, most insurances require one calendar year between physicals. Physicals include all preventive health topics, screenings, medical exam and labs that are appropriate for gender/age and history. You may have fasting labs needed at this visit. This is a well visit (not a sick visit), new problems should not be covered during this visit (see acute visit).  - Pediatric patients are seen more frequently when they are younger. Your provider will advise you on well child visit timing that is appropriate for your their age. - This is not a medicare wellness visit. Medicare wellness exams do not have an exam portion to the visit. Some medicare companies allow for a physical, some do not allow a yearly physical. If your medicare allows a yearly physical you can schedule the medicare wellness with our nurse Maudie Mercury and have your physical with your provider after, on the same day. Please check with insurance for your full benefits.   Late Policy/No  Shows:  - all new patients should arrive 15-30 minutes earlier than appointment to allow [place] time  to  obtain all personal demographics,  insurance information and for you to complete office paperwork. - All established patients should arrive 10-15 minutes earlier than appointment time to update all information and be checked in .  - In our best efforts to run on time, if you are late for your appointment you will be asked to either reschedule or if able, we will work you back into the schedule. There will be a wait time to work you back in the schedule,  depending on availability.  - If you are unable to make it to your appointment as scheduled, please call 24 hours ahead of time to allow [place] to fill the time slot with someone else who needs to be seen. If you do not cancel your appointment ahead of time, you may be charged a no show fee.

## 2019-08-23 ENCOUNTER — Encounter: Payer: Self-pay | Admitting: Family Medicine

## 2019-08-23 DIAGNOSIS — G43909 Migraine, unspecified, not intractable, without status migrainosus: Secondary | ICD-10-CM | POA: Insufficient documentation

## 2019-08-23 DIAGNOSIS — Z789 Other specified health status: Secondary | ICD-10-CM | POA: Insufficient documentation

## 2019-08-23 DIAGNOSIS — Z8249 Family history of ischemic heart disease and other diseases of the circulatory system: Secondary | ICD-10-CM | POA: Insufficient documentation

## 2019-08-31 ENCOUNTER — Encounter: Payer: Self-pay | Admitting: Family Medicine

## 2019-09-03 ENCOUNTER — Other Ambulatory Visit: Payer: Self-pay | Admitting: Family Medicine

## 2019-09-03 NOTE — Telephone Encounter (Signed)
Last OV and refill 08/20/19 #120 x 0 refills.  Pt advised to follow up with Dr Raoul Pitch around 09/17/19  Please advise, thanks.

## 2019-09-03 NOTE — Telephone Encounter (Signed)
Refilled a partial script for requested vistaril. please make sure pt has an appt scheduled prior to needing any additional refills. We were tapering on a new medication and We need to follow up and find out what dose worked for her before I can extend the prescription again.  Thanks.

## 2019-09-09 ENCOUNTER — Encounter: Payer: Self-pay | Admitting: Family Medicine

## 2019-10-20 ENCOUNTER — Other Ambulatory Visit: Payer: Self-pay | Admitting: Internal Medicine

## 2019-10-20 DIAGNOSIS — R490 Dysphonia: Secondary | ICD-10-CM

## 2019-10-27 ENCOUNTER — Ambulatory Visit
Admission: RE | Admit: 2019-10-27 | Discharge: 2019-10-27 | Disposition: A | Discharge status: Home / Self Care | Payer: BC Managed Care – PPO | Source: Ambulatory Visit | Attending: Internal Medicine | Admitting: Internal Medicine

## 2019-10-27 DIAGNOSIS — R490 Dysphonia: Secondary | ICD-10-CM

## 2019-12-06 ENCOUNTER — Ambulatory Visit: Payer: BC Managed Care – PPO | Admitting: Family Medicine

## 2019-12-13 ENCOUNTER — Telehealth: Payer: Self-pay

## 2019-12-13 NOTE — Telephone Encounter (Signed)
Patient states that she tried to pull tick off of her and head is still stuck in her skin.  Please advise.  [phone]

## 2019-12-13 NOTE — Telephone Encounter (Signed)
Please schedule her for an appointment.  Thanks!

## 2019-12-20 ENCOUNTER — Other Ambulatory Visit: Payer: Self-pay

## 2019-12-20 ENCOUNTER — Ambulatory Visit: Payer: BC Managed Care – PPO | Admitting: Family Medicine

## 2019-12-20 ENCOUNTER — Encounter: Payer: Self-pay | Admitting: Family Medicine

## 2019-12-20 VITALS — BP 91/68 | HR 95 | Temp 97.3°F | Resp 17 | Ht 69.0 in | Wt 134.0 lb

## 2019-12-20 DIAGNOSIS — S70261A Insect bite (nonvenomous), right hip, initial encounter: Secondary | ICD-10-CM | POA: Diagnosis not present

## 2019-12-20 DIAGNOSIS — F5101 Primary insomnia: Secondary | ICD-10-CM | POA: Diagnosis not present

## 2019-12-20 DIAGNOSIS — J302 Other seasonal allergic rhinitis: Secondary | ICD-10-CM | POA: Diagnosis not present

## 2019-12-20 DIAGNOSIS — G43809 Other migraine, not intractable, without status migrainosus: Secondary | ICD-10-CM | POA: Diagnosis not present

## 2019-12-20 DIAGNOSIS — W57XXXA Bitten or stung by nonvenomous insect and other nonvenomous arthropods, initial encounter: Secondary | ICD-10-CM

## 2019-12-20 MED ORDER — HYDROXYZINE HCL 10 MG PO TABS: 20.0000 mg | ORAL_TABLET | Freq: Every day | ORAL | 3 refills | Status: DC

## 2019-12-20 MED ORDER — ZOLMITRIPTAN 5 MG PO TABS: 5.0000 mg | ORAL_TABLET | ORAL | 5 refills | Status: DC | PRN

## 2019-12-20 MED ORDER — TRAZODONE HCL 50 MG PO TABS: 25.0000 mg | ORAL_TABLET | Freq: Every evening | ORAL | 3 refills | Status: DC | PRN

## 2019-12-20 MED ORDER — MONTELUKAST SODIUM 10 MG PO TABS: 10.0000 mg | ORAL_TABLET | Freq: Every day | ORAL | 3 refills | Status: DC

## 2019-12-20 NOTE — Patient Instructions (Addendum)
Trazodone take 1/2 tab for 3 nights, about 1 hour before bed. If needed only may increase to 1 tab a night.    Xyzal (OTC) 5 mg before bed.  Start Singulair before bed also. Wait until you have the trazodone dose figured out before starting.  (about a week)  Follow up in 3 months.   Tick- clean with peroxide later today.  Keep covered with neosporin until healed.

## 2019-12-20 NOTE — Progress Notes (Signed)
Patient ID: [name] [name], female  DOB: 1970-10-15, 49 y.o.   MRN: [id number] Patient Care Team    Relationship Specialty Notifications Start End  [name], DO PCP - General Family Medicine  08/23/19   Fay Records, MD Consulting Physician Cardiology  08/23/19   Arta Silence, MD Consulting Physician Gastroenterology  08/23/19   Otelia Sergeant, OD Referring Physician   08/23/19   Jenelle Mages, MD Referring Physician Endocrinology  08/23/19     Chief Complaint  Patient presents with  . Migraine    Pt was taking medication, she has been out of this med. It did help but was giving her daytime drowsiness.     Subjective: [name] [name] is a 49 y.o.  female present for  migraine without status migrainosus, not intractable Patient reports she has a chronic migraine history.  [name] been better controlled over the last few months. She uses zomig, which is effective.   Nasal congestion:  Pt reports more frequent nasal congestion over the last few months. She has had significant allergies in the past requiring routine allergy shots. She is currently not taking a daily antihistamine.   Insomnia: Her headaches are sometime related to lack of sleep. She had been prescribed vistaril which she felt did help. HOwever 20 mg made her too sleepy and 10 mg did not help.   Tick bite: Pt reports she had a tick bite on her hip and she pulled it off. She feels the head is still within the bite.    Patient's last menstrual period was 11/22/2019 (exact date).   Depression screen [facility] 2/9 08/20/2019  Decreased Interest 0  Down, Depressed, Hopeless 0  PHQ - 2 Score 0   No flowsheet data found.      No flowsheet data found.  Immunization History  Administered Date(s) Administered  . Hepatitis A 04/03/2018  . Hepatitis B 04/03/2018, 06/04/2018  . Influenza Inj Mdck Quad Pf 02/15/2018  . Influenza-Unspecified 02/23/2019  . Rho (D) Immune Globulin 08/14/2011  . Tdap 04/03/2018   . Typhoid Live 04/06/2018    No exam data present  Past Medical History:  Diagnosis Date  . Chicken pox   . Closed displaced fracture of right great toe 09/01/2018  . Constipation   . Fibromyalgia   . Hashimoto's disease   . Hyperlipidemia   . Hypothyroidism   . IBS (irritable bowel syndrome)   . Menopausal syndrome   . Migraines   . PONV (postoperative nausea and vomiting)   . 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 Seasonal allergies    Allergies  Allergen Reactions  . Thimerosal Hives  . Pneumococcal Vaccine Hives, Itching, Nausea Only and Rash   Past Surgical History:  Procedure Laterality Date  . COLONOSCOPY  2016  . DILATION AND CURETTAGE OF UTERUS  2006   missed ab  . DILATION AND EVACUATION  08/14/2011   Procedure: DILATATION AND EVACUATION;  Surgeon: Logan Bores, MD;  Location: [facility];  Service: Gynecology;  Laterality: N/A;  . WISDOM TOOTH EXTRACTION     Family History  Problem Relation Age of Onset  . Hypertension Mother   . Arthritis Mother   . Depression Mother   . Diabetes Mother   . Hyperlipidemia Mother   . Mental illness Mother   . Hypertension Father   . Early death Father 43  . Anuerysm Father 82       brain  . Depression  Brother   . Mental illness Brother   . Asthma Maternal Grandmother   . Depression Maternal Grandmother   . Heart disease Maternal Grandmother   . Depression Paternal Grandfather   . Asthma Daughter    Social History   Social History Narrative   Marital status/children/pets: Married.  5 children.   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   Safety:      -smoke alarm in the home:Yes     - wears seatbelt: Yes     - Feels safe in their relationships: Yes       Allergies as of 12/20/2019      Reactions   Thimerosal Hives   Pneumococcal Vaccine Hives, Itching, Nausea Only, Rash      Medication List       Accurate as of December 20, 2019 11:59 PM. If you have any questions, ask your  nurse or doctor.        Aloe Vera 25 MG Caps Take 4 capsules by mouth daily.   aspirin EC 81 MG tablet Take 81 mg by mouth as needed.   hydrOXYzine 10 MG tablet Commonly known as: ATARAX/VISTARIL Take 2-3 tablets (20-30 mg total) by mouth at bedtime. What changed: how much to take Changed by: [name], DO   Magnesium 400 MG Caps Take by mouth. 2 Capsules daily   Melatonin 10 MG Caps Take by mouth.   montelukast 10 MG tablet Commonly known as: SINGULAIR Take 1 tablet (10 mg total) by mouth at bedtime. Started by: [name], DO   multivitamin with minerals Tabs tablet Take 1 tablet by mouth every morning.   Oil of Oregano 1500 MG Caps Take by mouth.   OVER THE COUNTER MEDICATION Candi Bactin   PRESCRIPTION MEDICATION Take 1 capsule by mouth daily. Levothyroxine 266mcg, Liothyronine 61mcg Slow Release Capsule (40)   pseudoephedrine 120 MG 12 hr tablet Commonly known as: SUDAFED Take 120 mg by mouth 2 (two) times daily.   saccharomyces boulardii 250 MG capsule Commonly known as: FLORASTOR Take 250 mg by mouth 2 (two) times daily.   selenium 50 MCG Tabs tablet Take 50 mcg by mouth daily.   traZODone 50 MG tablet Commonly known as: DESYREL Take 0.5-1 tablets (25-50 mg total) by mouth at bedtime as needed for sleep. Started by: [name], DO   zolmitriptan 5 MG tablet Commonly known as: ZOMIG Take 1 tablet (5 mg total) by mouth as needed.       All past medical history, surgical history, allergies, family history, immunizations andmedications were updated in the EMR today and reviewed under the history and medication portions of their EMR.    No results found for this or any previous visit (from the past 2160 hour(s)).    ROS: 14 pt review of systems performed and negative (unless mentioned in an HPI)  Objective: BP 91/68 (BP Location: Left Arm, Patient Position: Sitting, Cuff Size: Normal)   Pulse 95   Temp (!) 97.3 F (36.3 C) (Temporal)    Resp 17   Ht 5\' 9"  (1.753 m)   Wt 134 lb (60.8 kg)   LMP 11/22/2019 (Exact Date)   SpO2 96%   BMI 19.79 kg/m  Gen: Afebrile. No acute distress. Nontoxic. Female.  HENT: AT. [place]. Bilateral TM visualized and normal in appearance. MMM. Bilateral nares without erythema. Mild swelling and drainage pressent. Throat without erythema or exudates. No cough or hoarseness.  Eyes:Pupils Equal Round Reactive to light, Extraocular movements intact,  Conjunctiva  without redness, discharge or icterus. Neck/lymp/endocrine: Supple,no lymphadenopathy CV: RRR  Chest: CTAB, no wheeze or crackles Skin: no rashes, purpura or petechiae. Insect bite right hip. No erythema, no drainage or bleeding. Under magnification small black FB present.  Neuro: Normal gait. PERLA. EOMi. Alert. Oriented x3    Assessment/plan: JENNIKA RINGGOLD is a 49 y.o. female present for est care migraine without status migrainosus, not intractable/vegetarian/sleep disturbance/family history of brain aneurysm in her father Continue  B12 over-the-counter supplementation and magnesium. Continue  Zomig Continue Vistaril as needed. Considered amitriptyline but with her chronic constipation avoided due to potential worsening of her constipation. Family history of brain aneurysm in her father, low threshold of imaging if worsening.   Primary insomnia Vistaril helped, but made her too sleepy.  Discussed trazodone and she would like to try.  trazodone tapering instructed.   Seasonal allergies Start OTC zyrtec QHS Start singulair  Tick bite of right hip, initial encounter Under magnification possible small remnant of insect in wound. Wound cleansed today and remnant easily removed. Pt tolerated.     Return in about 3 months (around 03/21/2020) for CMC (30 min).  No orders of the defined types were placed in this encounter.  Meds ordered this encounter  Medications  . zolmitriptan (ZOMIG) 5 MG tablet    Sig: Take 1 tablet (5 mg  total) by mouth as needed.    Dispense:  10 tablet    Refill:  5    Hold until pt request  . hydrOXYzine (ATARAX/VISTARIL) 10 MG tablet    Sig: Take 2-3 tablets (20-30 mg total) by mouth at bedtime.    Dispense:  90 tablet    Refill:  3  . traZODone (DESYREL) 50 MG tablet    Sig: Take 0.5-1 tablets (25-50 mg total) by mouth at bedtime as needed for sleep.    Dispense:  30 tablet    Refill:  3  . montelukast (SINGULAIR) 10 MG tablet    Sig: Take 1 tablet (10 mg total) by mouth at bedtime.    Dispense:  30 tablet    Refill:  3   Referral Orders  No referral(s) requested today     Note is dictated utilizing voice recognition software. Although note has been proof read prior to signing, occasional typographical errors still can be missed. If any questions arise, please do not hesitate to call for verification.  Electronically signed by: [name], DO [place]

## 2019-12-27 DIAGNOSIS — F5101 Primary insomnia: Secondary | ICD-10-CM | POA: Insufficient documentation

## 2019-12-27 DIAGNOSIS — J302 Other seasonal allergic rhinitis: Secondary | ICD-10-CM | POA: Insufficient documentation

## 2019-12-27 HISTORY — DX: Primary insomnia: F51.01

## 2020-01-07 ENCOUNTER — Other Ambulatory Visit: Payer: Self-pay | Admitting: Obstetrics and Gynecology

## 2020-01-07 DIAGNOSIS — Z1231 Encounter for screening mammogram for malignant neoplasm of breast: Secondary | ICD-10-CM

## 2020-01-11 ENCOUNTER — Other Ambulatory Visit: Payer: Self-pay | Admitting: Family Medicine

## 2020-01-11 NOTE — Telephone Encounter (Signed)
Ok to fill 

## 2020-01-18 ENCOUNTER — Other Ambulatory Visit: Payer: Self-pay

## 2020-01-18 ENCOUNTER — Ambulatory Visit
Admission: RE | Admit: 2020-01-18 | Discharge: 2020-01-18 | Disposition: A | Discharge status: Home / Self Care | Payer: BC Managed Care – PPO | Source: Ambulatory Visit | Attending: Obstetrics and Gynecology | Admitting: Obstetrics and Gynecology

## 2020-01-18 DIAGNOSIS — Z1231 Encounter for screening mammogram for malignant neoplasm of breast: Secondary | ICD-10-CM

## 2020-01-18 LAB — HM MAMMOGRAPHY

## 2020-02-09 ENCOUNTER — Other Ambulatory Visit: Payer: Self-pay | Admitting: Family Medicine

## 2020-02-18 ENCOUNTER — Telehealth: Payer: Self-pay

## 2020-02-18 NOTE — Telephone Encounter (Signed)
Patient is experiencing bilateral swelling in her legs. Is has been happening for a while, it doesn't happen every day but today it is particularly bad. Please advise.

## 2020-02-18 NOTE — Telephone Encounter (Signed)
Spoke with patient regarding symptoms.  Patient reports intermittent, bilateral leg edema x 2 months, none today.  Denies pain, hot spots, SOB, or injury. No change in diet or meds.  Advised if pain, hot spots or SOB begins, go to ER. Encouraged elevating legs, drinking plenty of water and limit alcohol and sodium consumption.   Appt with PCP scheduled for Wednesday, 02/23/2020.

## 2020-02-23 ENCOUNTER — Ambulatory Visit: Payer: BC Managed Care – PPO | Admitting: Family Medicine

## 2020-02-23 ENCOUNTER — Other Ambulatory Visit: Payer: Self-pay

## 2020-02-23 ENCOUNTER — Encounter: Payer: Self-pay | Admitting: Family Medicine

## 2020-02-23 VITALS — BP 114/72 | HR 85 | Temp 98.4°F | Ht 65.0 in | Wt 132.2 lb

## 2020-02-23 DIAGNOSIS — Z23 Encounter for immunization: Secondary | ICD-10-CM

## 2020-02-23 DIAGNOSIS — F5101 Primary insomnia: Secondary | ICD-10-CM

## 2020-02-23 DIAGNOSIS — R252 Cramp and spasm: Secondary | ICD-10-CM | POA: Diagnosis not present

## 2020-02-23 DIAGNOSIS — J302 Other seasonal allergic rhinitis: Secondary | ICD-10-CM | POA: Diagnosis not present

## 2020-02-23 DIAGNOSIS — G43809 Other migraine, not intractable, without status migrainosus: Secondary | ICD-10-CM

## 2020-02-23 LAB — COMPREHENSIVE METABOLIC PANEL
ALT: 9 U/L (ref 0–35)
AST: 15 U/L (ref 0–37)
Albumin: 4.3 g/dL (ref 3.5–5.2)
Alkaline Phosphatase: 42 U/L (ref 39–117)
BUN: 12 mg/dL (ref 6–23)
CO2: 26 mEq/L (ref 19–32)
Calcium: 9.2 mg/dL (ref 8.4–10.5)
Chloride: 103 mEq/L (ref 96–112)
Creatinine, Ser: 0.65 mg/dL (ref 0.40–1.20)
GFR: 95.98 mL/min (ref >60.00)
Glucose, Bld: 81 mg/dL (ref 70–99)
Potassium: 4.4 mEq/L (ref 3.5–5.1)
Sodium: 136 mEq/L (ref 135–145)
Total Bilirubin: 0.5 mg/dL (ref 0.2–1.2)
Total Protein: 6.8 g/dL (ref 6.0–8.3)

## 2020-02-23 LAB — HM COLONOSCOPY

## 2020-02-23 MED ORDER — AZELASTINE-FLUTICASONE 137-50 MCG/ACT NA SUSP: NASAL | 11 refills | Status: DC

## 2020-02-23 MED ORDER — HYDROCHLOROTHIAZIDE 25 MG PO TABS
12.5000 mg | ORAL_TABLET | Freq: Every day | ORAL | 3 refills | Status: DC
Start: 2020-02-23 — End: 2020-06-26

## 2020-02-23 MED ORDER — TRAZODONE HCL 50 MG PO TABS: 25.0000 mg | ORAL_TABLET | Freq: Every evening | ORAL | 1 refills | Status: DC | PRN

## 2020-02-23 MED ORDER — MONTELUKAST SODIUM 10 MG PO TABS: ORAL_TABLET | ORAL | 1 refills | Status: DC

## 2020-02-23 NOTE — Progress Notes (Signed)
This visit occurred during the SARS-CoV-2 public health emergency.  Safety protocols were in place, including screening questions prior to the visit, additional usage of staff PPE, and extensive cleaning of exam room while observing appropriate contact time as indicated for disinfecting solutions.    [name] [name] , 1971/06/02, 49 y.o., female MRN: [id number] Patient Care Team    Relationship Specialty Notifications Start End  [name], DO PCP - General Family Medicine  08/23/19   Fay Records, MD Consulting Physician Cardiology  08/23/19   Arta Silence, MD Consulting Physician Gastroenterology  08/23/19   Otelia Sergeant, OD Referring Physician   08/23/19   Jenelle Mages, MD Referring Physician Endocrinology  08/23/19     Chief Complaint  Patient presents with  . Leg Swelling  . Allergies     Subjective: Pt presents for an OV with complaints of lower extremity swelling over the last 3-4 weeks. She states it is greatly improved today. It is occurring intermittently and resolves once feet are elevated. migraine without status migrainosus, not intractable Patient reports she has a chronic migraine history.  [name] been better controlled over the last few months. Zomig is effective  Nasal congestion:  Pt reports more frequent nasal congestion over the last few months. She has had significant allergies in the past requiring routine allergy shots. She is currently compliant with Singulair, Vistaril as needed and Zyrtec daily. She uses Flonase nasal spray on occasions. She does not want to be referred back for allergy shots at this time.  Insomnia: Her headaches are sometime related to lack of sleep. She had been prescribed vistaril which she felt did help. Trazodone taper was started last visit and she reports it is working well for her.   Depression screen [facility] 2/9 08/20/2019  Decreased Interest 0  Down, Depressed, Hopeless 0  PHQ - 2 Score 0    Allergies  Allergen  Reactions  . Thimerosal Hives  . Pneumococcal Vaccine Hives, Itching, Nausea Only and Rash   Social History   Social History Narrative   Marital status/children/pets: Married.  5 children.   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   Safety:      -smoke alarm in the home:Yes     - wears seatbelt: Yes     - Feels safe in their relationships: Yes      Past Medical History:  Diagnosis Date  . Chicken pox   . Closed displaced fracture of right great toe 09/01/2018  . Constipation   . Fibromyalgia   . Hashimoto's disease   . Hyperlipidemia   . Hypothyroidism   . IBS (irritable bowel syndrome)   . Menopausal syndrome   . Migraines   . PONV (postoperative nausea and vomiting)   . 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 Seasonal allergies    Past Surgical History:  Procedure Laterality Date  . COLONOSCOPY  2016  . DILATION AND CURETTAGE OF UTERUS  2006   missed ab  . DILATION AND EVACUATION  08/14/2011   Procedure: DILATATION AND EVACUATION;  Surgeon: Logan Bores, MD;  Location: [facility];  Service: Gynecology;  Laterality: N/A;  . WISDOM TOOTH EXTRACTION     Family History  Problem Relation Age of Onset  . Hypertension Mother   . Arthritis Mother   . Depression Mother   . Diabetes Mother   . Hyperlipidemia Mother   . Mental illness Mother   . Hypertension Father   .  Early death Father 27  . Anuerysm Father 28       brain  . Depression Brother   . Mental illness Brother   . Asthma Maternal Grandmother   . Depression Maternal Grandmother   . Heart disease Maternal Grandmother   . Depression Paternal Grandfather   . Asthma Daughter    Allergies as of 02/23/2020      Reactions   Thimerosal Hives   Pneumococcal Vaccine Hives, Itching, Nausea Only, Rash      Medication List       Accurate as of February 23, 2020 11:49 AM. If you have any questions, ask your nurse or doctor.        STOP taking these medications   Aloe Vera 25 MG  Caps Stopped by: [name], DO   Magnesium 400 MG Caps Stopped by: [name], DO   Melatonin 10 MG Caps Stopped by: [name], DO   multivitamin with minerals Tabs tablet Stopped by: [name], DO   Oil of Oregano 1500 MG Caps Stopped by: [name], DO   OVER THE COUNTER MEDICATION Stopped by: [name], DO   pseudoephedrine 120 MG 12 hr tablet Commonly known as: SUDAFED Stopped by: [name], DO   saccharomyces boulardii 250 MG capsule Commonly known as: FLORASTOR Stopped by: [name], DO     TAKE these medications   aspirin EC 81 MG tablet Take 81 mg by mouth as needed.   Azelastine-Fluticasone 137-50 MCG/ACT Susp Commonly known as: Dymista One spray per nostril 2 times a day. Started by: [name], DO   hydrOXYzine 10 MG tablet Commonly known as: ATARAX/VISTARIL Take 2-3 tablets (20-30 mg total) by mouth at bedtime.   montelukast 10 MG tablet Commonly known as: SINGULAIR TAKE 1 TABLET BY MOUTH EVERYDAY AT BEDTIME   PRESCRIPTION MEDICATION Take 1 capsule by mouth daily. Levothyroxine 213mg, Liothyronine 170m Slow Release Capsule (40)   selenium 50 MCG Tabs tablet Take 50 mcg by mouth daily.   traZODone 50 MG tablet Commonly known as: DESYREL Take 0.5-1 tablets (25-50 mg total) by mouth at bedtime as needed for sleep.   zolmitriptan 5 MG tablet Commonly known as: ZOMIG Take 1 tablet (5 mg total) by mouth as needed.       All past medical history, surgical history, allergies, family history, immunizations andmedications were updated in the EMR today and reviewed under the history and medication portions of their EMR.     ROS: Negative, with the exception of above mentioned in HPI   Objective:  BP 114/72 (BP Location: Left Arm, Patient Position: Sitting, Cuff Size: Normal)   Pulse 85   Temp 98.4 F (36.9 C) (Oral)   Ht 5' 5" (1.651 m)   Wt 132 lb 4 oz (60 kg)   SpO2 98%   BMI 22.01 kg/m  Body mass index is 22.01  kg/m. Gen: Afebrile. No acute distress. Nontoxic in appearance, well developed, well nourished.  HENT: AT. [place]. Eyes:Pupils Equal Round Reactive to light, Extraocular movements intact,  Conjunctiva without redness, discharge or icterus. Neck/lymp/endocrine: Supple, no lymphadenopathy CV: RRR no murmur, no edema Chest: CTAB, no wheeze or crackles. Good air movement, normal resp effort.  Skin: no rashes, purpura or petechiae.  Neuro:  Normal gait. PERLA. EOMi. Alert. Oriented x3  Psych: Normal affect, dress and demeanor. Normal speech. Normal thought content and judgment.  No exam data present No results found. No results found for this or any previous visit (from the past 24  hour(s)).  Assessment/Plan: [name] [name] is a 49 y.o. female present for OV for  Seasonal allergies Could use some improvement. Continue Singulair Continue Zyrtec or Xyzal Continue Vistaril as needed Start Dymista If symptoms are still not well controlled would consider referral to allergy and asthma  Primary insomnia Improved. Continue trazodone  Other migraine without status migrainosus, not intractable Stable. Continue Zomig  Leg cramps Discussed possibly related to electrolytes versus thyroid. Thyroid is managed by integrative medicine - Comp Met (CMET)  Need for influenza vaccination - Flu Vaccine QUAD 36+ mos IM   Reviewed expectations re: course of current medical issues.  Discussed self-management of symptoms.  Outlined signs and symptoms indicating need for more acute intervention.  Patient verbalized understanding and all questions were answered.  Patient received an After-Visit Summary.    No orders of the defined types were placed in this encounter.  Meds ordered this encounter  Medications  . Azelastine-Fluticasone (DYMISTA) 137-50 MCG/ACT SUSP    Sig: One spray per nostril 2 times a day.    Dispense:  23 g    Refill:  11  . traZODone (DESYREL) 50 MG tablet    Sig:  Take 0.5-1 tablets (25-50 mg total) by mouth at bedtime as needed for sleep.    Dispense:  90 tablet    Refill:  1    Please do not send refill request.  Patient needs appointment for follow-up on new med start prior to refills.  . montelukast (SINGULAIR) 10 MG tablet    Sig: TAKE 1 TABLET BY MOUTH EVERYDAY AT BEDTIME    Dispense:  90 tablet    Refill:  1   Referral Orders  No referral(s) requested today     Note is dictated utilizing voice recognition software. Although note has been proof read prior to signing, occasional typographical errors still can be missed. If any questions arise, please do not hesitate to call for verification.   electronically signed by:  [name], DO  [place]

## 2020-02-23 NOTE — Patient Instructions (Addendum)
Flu shot completed today.   I will call you with lab results.   I will prescribe you a low dose water pill to take only if needed with swelling.  I added Dymista nasal spray to your regimen.    Schedule physical in about 3 months. We will complete fasting labs that day.

## 2020-02-24 ENCOUNTER — Encounter: Payer: Self-pay | Admitting: Family Medicine

## 2020-03-14 ENCOUNTER — Ambulatory Visit: Payer: BC Managed Care – PPO | Admitting: Family Medicine

## 2020-04-13 ENCOUNTER — Other Ambulatory Visit: Payer: Self-pay | Admitting: Family Medicine

## 2020-04-24 NOTE — Progress Notes (Signed)
New Patient Note  RE: [name] MRN: [id number] DOB: 1970-11-14 Date of Office Visit: 04/25/2020  Referring provider: No ref. provider found Primary care provider: [name], DO  Chief Complaint: other (voice will not clear.)  History of Present Illness: I had the pleasure of seeing [name] [name] for initial evaluation at the [facility] on 04/25/2020. She is a 49 y.o. female, who is self-referred here by [name] A, DO for the evaluation of throat clearing.  She reports symptoms of throat clearing, voice changes, PND. Symptoms have been going on for few years. The symptoms are present all year around. Other triggers include exposure to unknown. Anosmia: no. Headache: no. She has used Claritin, allegra, benadryl, zyrtec, Xyzal, Flonase, dymista, Singulair with no improvement in symptoms. Sinus infections: no. Previous work up includes: 10 years ago and patient was on allergy injections as a child and as an adult with some benefit. Previous ENT evaluation: yes a few years ago. Previous sinus imaging: yes. History of nasal polyps: no. History of reflux: no.  Assessment and Plan: [name] [name] is a 49 y.o. female with: Other allergic rhinitis Perennial rhinitis symptoms for many years.  The last few years noting postnasal drip and voice changes with throat clearing.  Patient used to be on allergy injections as a child and as an adult with some benefit.  Evaluated by ENT in the past as well.  Tried over-the-counter antihistamines, Flonase, Dymista and Singulair with no benefit.  Denies heartburn/reflux symptoms.  Today's skin testing showed: Positive to grass, ragweed, trees, mold, dust mites, cat and cockroach. Borderline to weeds, horse. Negative dog, rabbit and [place] pigs.   Start environmental control measures as below.  Start Ryvent 1 tablet twice a day. Sample given if not covered let us know.  Continue Singulair (montelukast) 10mg  daily at  night. Continue dymista (fluticasone + azelastine nasal spray combination) 1 spray per nostril twice a day.  Nasal saline spray (i.e., Simply Saline) or nasal saline lavage (i.e., NeilMed) is recommended as needed and prior to medicated nasal sprays.  Had a detailed discussion with patient/family that clinical history is suggestive of allergic rhinitis, and may benefit from allergy immunotherapy (AIT). Discussed in detail regarding the dosing, schedule, side effects (mild to moderate local allergic reaction and rarely systemic allergic reactions including anaphylaxis), and benefits (significant improvement in nasal symptoms, seasonal flares of asthma) of immunotherapy with the patient. There is significant time commitment involved with allergy shots, which includes weekly immunotherapy injections for first 9-12 months and then biweekly to monthly injections for 3-5 years.   Consent was signed.  Start allergy injections.   For mild symptoms you can take over the counter antihistamines such as Benadryl and monitor symptoms closely. If symptoms worsen or if you have severe symptoms including breathing issues, throat closure, significant swelling, whole body hives, severe diarrhea and vomiting, lightheadedness then inject epinephrine and seek immediate medical care afterwards.  Action plan given.   Follow up with ENT as scheduled.   Return in about 3 months (around 07/26/2020).  Meds ordered this encounter  Medications  . Carbinoxamine Maleate (RYVENT) 6 MG TABS    Sig: Take 1 tablet by mouth in the morning and at bedtime.    Dispense:  60 tablet    Refill:  5   Other allergy screening: Asthma: no Food allergy: no Medication allergy: yes Hymenoptera allergy: large localized reactions. Urticaria: no Eczema:no History of recurrent infections suggestive of immunodeficency: no  Diagnostics: Skin Testing: Environmental allergy panel. Positive to grass, ragweed, trees, mold, dust mites, cat  and cockroach. Borderline to weeds, horse. Negative dog, rabbit and [place] pigs.  Results discussed with patient/family.  Airborne Adult Perc - 04/25/20 1437    Time Antigen Placed 0210    Allergen Manufacturer Lavella Hammock    Location Back    Number of Test 59    Panel 1 Select    1. Control-Buffer 50% Glycerol Negative    2. Control-Histamine 1 mg/ml 2+    3. Albumin saline Negative    4. [place] Negative    5. [place] Negative    6. Johnson Negative    7. [place] Blue 3+    8. Meadow Fescue Negative    9. Perennial Rye 4+    10. Sweet Vernal 3+    11. Timothy 4+    12. Cocklebur Negative    13. Burweed Marshelder Negative    14. Ragweed, short 2+    15. Ragweed, Giant Negative    16. Plantain,  English --   +/-   17. Lamb's Quarters Negative    18. Sheep Sorrell --   +/-   19. Rough Pigweed Negative    20. Marsh Elder, Rough Negative    21. Mugwort, Common Negative    22. Ash mix Negative    23. Birch mix 4+    24. Beech American 2+    25. Box, Elder --   +/-   27. Cedar, red 3+    27. Cottonwood, [place] --   +/-   86. Elm mix Negative    29. Hickory 2+    30. Maple mix --   +/-   31. Oak, [place] mix 3+    32. Pecan Pollen 2+    33. Pine mix Negative    34. Sycamore Eastern Negative    35. [place], Black Pollen Negative    36. Alternaria alternata 4+    37. Cladosporium Herbarum 2+    38. Aspergillus mix Negative    39. Penicillium mix Negative    40. Bipolaris sorokiniana (Helminthosporium) 3+    41. Drechslera spicifera (Curvularia) 3+    42. Mucor plumbeus Negative    43. Fusarium moniliforme 3+    44. Aureobasidium pullulans (pullulara) Negative    45. Rhizopus oryzae Negative    46. Botrytis cinera 2+    47. Epicoccum nigrum 2+    48. Phoma betae Negative    49. Candida Albicans Negative    50. Trichophyton mentagrophytes Negative    51. Mite, D Farinae  5,000 AU/ml 2+    52. Mite, D Pteronyssinus  5,000 AU/ml 2+    53. Cat Hair 10,000 BAU/ml 3+    54.   Dog Epithelia Negative    55. Mixed Feathers Negative    56. Horse Epithelia --   +/-   76. Cockroach, German Negative    58. Mouse Negative    59. Tobacco Leaf Negative          Intradermal - 04/25/20 1443    Time Antigen Placed 0240    Allergen Manufacturer Lavella Hammock    Location Arm    Number of Test 6    Intradermal Select    Control Negative    [place] 2+    Johnson Negative    Mold 1 Omitted    Mold 2 3+    Dog Negative    Cockroach 2+          Food  Adult Perc - 04/25/20 1400    2. [place] Pig Negative    3. Rabbit Negative           Past Medical History: Patient Active Problem List   Diagnosis Date Noted  . Other allergic rhinitis 04/25/2020  . Seasonal allergies 12/27/2019  . Primary insomnia 12/27/2019  . Vegetarian 08/23/2019  . Migraine 08/23/2019  . FH: brain aneurysm 08/23/2019  . Hypothyroidism due to Hashimoto's thyroiditis 01/31/2017  . Irritable bowel syndrome (IBS)- chronic abd pain 07/21/2007   Past Medical History:  Diagnosis Date  . Angio-edema   . Chicken pox   . Closed displaced fracture of right great toe 09/01/2018  . Constipation   . Fibromyalgia   . Hashimoto's disease   . Hyperlipidemia   . Hypothyroidism   . IBS (irritable bowel syndrome)   . Menopausal syndrome   . Migraines   . PONV (postoperative nausea and vomiting)   . 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 Seasonal allergies   . Urticaria    Past Surgical History: Past Surgical History:  Procedure Laterality Date  . COLONOSCOPY  2016  . DILATION AND CURETTAGE OF UTERUS  2006   missed ab  . DILATION AND EVACUATION  08/14/2011   Procedure: DILATATION AND EVACUATION;  Surgeon: Logan Bores, MD;  Location: [facility];  Service: Gynecology;  Laterality: N/A;  . WISDOM TOOTH EXTRACTION     Medication List:  Current Outpatient Medications  Medication Sig Dispense Refill  . aspirin EC 81 MG tablet Take 81 mg by mouth as needed.    . Azelastine-Fluticasone (DYMISTA)  137-50 MCG/ACT SUSP One spray per nostril 2 times a day. 23 g 11  . hydrochlorothiazide (HYDRODIURIL) 25 MG tablet Take 0.5 tablets (12.5 mg total) by mouth daily. 45 tablet 3  . hydrOXYzine (ATARAX/VISTARIL) 10 MG tablet TAKE 2-3 TABLETS (20-30 MG TOTAL) BY MOUTH AT BEDTIME. 90 tablet 3  . montelukast (SINGULAIR) 10 MG tablet TAKE 1 TABLET BY MOUTH EVERYDAY AT BEDTIME 90 tablet 1  . PRESCRIPTION MEDICATION Take 1 capsule by mouth daily. Levothyroxine 253mcg, Liothyronine 14mcg Slow Release Capsule (40)    . selenium 50 MCG TABS tablet Take 50 mcg by mouth daily.    . traZODone (DESYREL) 50 MG tablet Take 0.5-1 tablets (25-50 mg total) by mouth at bedtime as needed for sleep. 90 tablet 1  . zolmitriptan (ZOMIG) 5 MG tablet Take 1 tablet (5 mg total) by mouth as needed. 10 tablet 5  . Carbinoxamine Maleate (RYVENT) 6 MG TABS Take 1 tablet by mouth in the morning and at bedtime. 60 tablet 5   No current facility-administered medications for this visit.   Allergies: Allergies  Allergen Reactions  . Thimerosal Hives  . Pneumococcal Vaccine Hives, Itching, Nausea Only and Rash   Social History: Social History   Socioeconomic History  . Marital status: Married    Spouse name: Not on file  . Number of children: Not on file  . Years of education: Not on file  . Highest education level: Not on file  Occupational History  . Not on file  Tobacco Use  . Smoking status: Never Smoker  . Smokeless tobacco: Never Used  Vaping Use  . Vaping Use: Never used  Substance and Sexual Activity  . Alcohol use: Yes    Comment: socially   . Drug use: No  . Sexual activity: Not on file  Other Topics Concern  . Not on file  Social History  Narrative   Marital status/children/pets: Married.  5 children.   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   Safety:      -smoke alarm in the home:Yes     - wears seatbelt: Yes     - Feels safe in their relationships: Yes      Social  Determinants of Health   Financial Resource Strain:   . Difficulty of Paying Living Expenses: Not on file  Food Insecurity:   . Worried About Charity fundraiser in the Last Year: Not on file  . Ran Out of Food in the Last Year: Not on file  Transportation Needs:   . Lack of Transportation (Medical): Not on file  . Lack of Transportation (Non-Medical): Not on file  Physical Activity:   . Days of Exercise per Week: Not on file  . Minutes of Exercise per Session: Not on file  Stress:   . Feeling of Stress : Not on file  Social Connections:   . Frequency of Communication with Friends and Family: Not on file  . Frequency of Social Gatherings with Friends and Family: Not on file  . Attends Religious Services: Not on file  . Active Member of Clubs or Organizations: Not on file  . Attends Archivist Meetings: Not on file  . Marital Status: Not on file   Lives in a 51 year old home. Smoking: denies Occupation: Media planner HistoryFreight forwarder in the house: no Charity fundraiser in the family room: no Carpet in the bedroom: no Heating: gas Cooling: central Pet: yes 2 dogs x 8 yrs, 2 [place] pigs x 4 yrs, 4 birds x 5 yrs, 2 rabbits x 2 yrs  Family History: Family History  Problem Relation Age of Onset  . Hypertension Mother   . Arthritis Mother   . Depression Mother   . Diabetes Mother   . Hyperlipidemia Mother   . Mental illness Mother   . Hypertension Father   . Early death Father 28  . Anuerysm Father 39       brain  . Depression Brother   . Mental illness Brother   . Asthma Maternal Grandmother   . Depression Maternal Grandmother   . Heart disease Maternal Grandmother   . Depression Paternal Grandfather   . Asthma Daughter   . Allergic rhinitis Neg Hx   . Eczema Neg Hx   . Urticaria Neg Hx     Review of Systems  Constitutional: Negative for appetite change, chills, fever and unexpected weight change.  HENT: Positive for postnasal drip, sore  throat and voice change. Negative for congestion and rhinorrhea.   Eyes: Negative for itching.  Respiratory: Negative for cough, chest tightness, shortness of breath and wheezing.   Cardiovascular: Negative for chest pain.  Gastrointestinal: Negative for abdominal pain.  Genitourinary: Negative for difficulty urinating.  Skin: Negative for rash.  Allergic/Immunologic: Positive for environmental allergies.  Neurological: Negative for headaches.   Objective: BP 92/68 (BP Location: Right Arm, Patient Position: Sitting, Cuff Size: Normal)   Pulse 96   Temp (!) 97.1 F (36.2 C) (Temporal)   Resp 18   Ht 5' 5.9" (1.674 m)   Wt 138 lb (62.6 kg)   SpO2 97%   BMI 22.34 kg/m  Body mass index is 22.34 kg/m. Physical Exam Vitals and nursing note reviewed.  Constitutional:      Appearance: Normal appearance. She is well-developed.  HENT:     Head: Normocephalic and atraumatic.  Right Ear: External ear normal. There is impacted cerumen.     Left Ear: External ear normal.     Nose: Nose normal.     Mouth/Throat:     Mouth: Mucous membranes are moist.     Pharynx: Oropharynx is clear.  Eyes:     Conjunctiva/sclera: Conjunctivae normal.  Cardiovascular:     Rate and Rhythm: Normal rate and regular rhythm.     Heart sounds: Normal heart sounds. No murmur heard.  No friction rub. No gallop.   Pulmonary:     Effort: Pulmonary effort is normal.     Breath sounds: Normal breath sounds. No wheezing, rhonchi or rales.  Abdominal:     Palpations: Abdomen is soft.  Musculoskeletal:     Cervical back: Neck supple.  Skin:    General: Skin is warm.     Findings: No rash.  Neurological:     Mental Status: She is alert and oriented to person, place, and time.  Psychiatric:        Behavior: Behavior normal.    The plan was reviewed with the patient/family, and all questions/concerned were addressed.  It was my pleasure to see [name] [name] today and participate in her care. Please feel free  to contact me with any questions or concerns.  Sincerely,  Rexene Alberts, DO Allergy & Immunology  [facility] office: [place] office: [phone]

## 2020-04-25 ENCOUNTER — Ambulatory Visit: Payer: BC Managed Care – PPO | Admitting: Allergy

## 2020-04-25 ENCOUNTER — Encounter: Payer: Self-pay | Admitting: Allergy

## 2020-04-25 ENCOUNTER — Other Ambulatory Visit: Payer: Self-pay

## 2020-04-25 VITALS — BP 92/68 | HR 96 | Temp 97.1°F | Resp 18 | Ht 65.9 in | Wt 138.0 lb

## 2020-04-25 DIAGNOSIS — J3089 Other allergic rhinitis: Secondary | ICD-10-CM

## 2020-04-25 DIAGNOSIS — J302 Other seasonal allergic rhinitis: Secondary | ICD-10-CM | POA: Insufficient documentation

## 2020-04-25 HISTORY — DX: Other seasonal allergic rhinitis: J30.2

## 2020-04-25 MED ORDER — CARBINOXAMINE MALEATE 6 MG PO TABS: 1.0000 | ORAL_TABLET | Freq: Two times a day (BID) | ORAL | 5 refills | Status: DC

## 2020-04-25 NOTE — Patient Instructions (Addendum)
Today's skin testing showed: Positive to grass, ragweed, trees, mold, dust mites, cat and cockroach. Borderline to weeds, horse. Negative dog, rabbit and [place] pigs.   Environmental allergies  Start environmental control measures as below.  Start Ryvent 1 tablet twice a day. Sample given if not covered let us know.  Continue Singulair (montelukast) 10mg  daily at night. Continue dymista (fluticasone + azelastine nasal spray combination) 1 spray per nostril twice a day.  Nasal saline spray (i.e., Simply Saline) or nasal saline lavage (i.e., NeilMed) is recommended as needed and prior to medicated nasal sprays.  Had a detailed discussion with patient/family that clinical history is suggestive of allergic rhinitis, and may benefit from allergy immunotherapy (AIT). Discussed in detail regarding the dosing, schedule, side effects (mild to moderate local allergic reaction and rarely systemic allergic reactions including anaphylaxis), and benefits (significant improvement in nasal symptoms, seasonal flares of asthma) of immunotherapy with the patient. There is significant time commitment involved with allergy shots, which includes weekly immunotherapy injections for first 9-12 months and then biweekly to monthly injections for 3-5 years.   Consent was signed.  For mild symptoms you can take over the counter antihistamines such as Benadryl and monitor symptoms closely. If symptoms worsen or if you have severe symptoms including breathing issues, throat closure, significant swelling, whole body hives, severe diarrhea and vomiting, lightheadedness then inject epinephrine and seek immediate medical care afterwards.  Action plan given.    Follow up with ENT as scheduled.   Follow up in 3 months or sooner if needed.  Follow up in 3 weeks or sooner if needed.  Reducing Pollen Exposure . Pollen seasons: trees (spring), grass (summer) and ragweed/weeds (fall). Marland Kitchen Keep windows closed in your home and  car to lower pollen exposure.  Susa Simmonds air conditioning in the bedroom and throughout the house if possible.  . Avoid going out in dry windy days - especially early morning. . Pollen counts are highest between 5 - 10 AM and on dry, hot and windy days.  . Save outside activities for late afternoon or after a heavy rain, when pollen levels are lower.  . Avoid mowing of grass if you have grass pollen allergy. Marland Kitchen Be aware that pollen can also be transported indoors on people and pets.  . Dry your clothes in an automatic dryer rather than hanging them outside where they might collect pollen.  . Rinse hair and eyes before bedtime. Mold Control . Mold and fungi can grow on a variety of surfaces provided certain temperature and moisture conditions exist.  . Outdoor molds grow on plants, decaying vegetation and soil. The major outdoor mold, Alternaria and Cladosporium, are found in very high numbers during hot and dry conditions. Generally, a late summer - fall peak is seen for common outdoor fungal spores. Rain will temporarily lower outdoor mold spore count, but counts rise rapidly when the rainy period ends. . The most important indoor molds are Aspergillus and Penicillium. Dark, humid and poorly ventilated basements are ideal sites for mold growth. The next most common sites of mold growth are the bathroom and the kitchen. Outdoor (Seasonal) Mold Control . Use air conditioning and keep windows closed. . Avoid exposure to decaying vegetation. Marland Kitchen Avoid leaf raking. . Avoid grain handling. . Consider wearing a face mask if working in moldy areas.  Indoor (Perennial) Mold Control  . Maintain humidity below 50%. . Get rid of mold growth on hard surfaces with water, detergent and, if necessary, 5% bleach (do  not mix with other cleaners). Then dry the area completely. If mold covers an area more than 10 square feet, consider hiring an indoor environmental professional. . For clothing, washing with soap and  water is best. If moldy items cannot be cleaned and dried, throw them away. . Remove sources e.g. contaminated carpets. . Repair and seal leaking roofs or pipes. Using dehumidifiers in damp basements may be helpful, but empty the water and clean units regularly to prevent mildew from forming. All rooms, especially basements, bathrooms and kitchens, require ventilation and cleaning to deter mold and mildew growth. Avoid carpeting on concrete or damp floors, and storing items in damp areas. Control of House Dust Mite Allergen . Dust mite allergens are a common trigger of allergy and asthma symptoms. While they can be found throughout the house, these microscopic creatures thrive in warm, humid environments such as bedding, upholstered furniture and carpeting. . Because so much time is spent in the bedroom, it is essential to reduce mite levels there.  . Encase pillows, mattresses, and box springs in special allergen-proof fabric covers or airtight, zippered plastic covers.  . Bedding should be washed weekly in hot water (130 F) and dried in a hot dryer. Allergen-proof covers are available for comforters and pillows that can't be regularly washed.  Wendee Copp the allergy-proof covers every few months. Minimize clutter in the bedroom. Keep pets out of the bedroom.  Marland Kitchen Keep humidity less than 50% by using a dehumidifier or air conditioning. You can buy a humidity measuring device called a hygrometer to monitor this.  . If possible, replace carpets with hardwood, linoleum, or washable area rugs. If that's not possible, vacuum frequently with a vacuum that has a HEPA filter. . Remove all upholstered furniture and non-washable window drapes from the bedroom. . Remove all non-washable stuffed toys from the bedroom.  Wash stuffed toys weekly. Pet Allergen Avoidance: . Contrary to popular opinion, there are no "hypoallergenic" breeds of dogs or cats. That is because people are not allergic to an animal's hair, but to  an allergen found in the animal's saliva, dander (dead skin flakes) or urine. Pet allergy symptoms typically occur within minutes. For some people, symptoms can build up and become most severe 8 to 12 hours after contact with the animal. People with severe allergies can experience reactions in public places if dander has been transported on the pet owners' clothing. Marland Kitchen Keeping an animal outdoors is only a partial solution, since homes with pets in the yard still have higher concentrations of animal allergens. . Before getting a pet, ask your allergist to determine if you are allergic to animals. If your pet is already considered part of your family, try to minimize contact and keep the pet out of the bedroom and other rooms where you spend a great deal of time. . As with dust mites, vacuum carpets often or replace carpet with a hardwood floor, tile or linoleum. . High-efficiency particulate air (HEPA) cleaners can reduce allergen levels over time. . While dander and saliva are the source of cat and dog allergens, urine is the source of allergens from rabbits, hamsters, mice and [place] pigs; so ask a non-allergic family member to clean the animal's cage. . If you have a pet allergy, talk to your allergist about the potential for allergy immunotherapy (allergy shots). This strategy can often provide long-term relief. Cockroach Allergen Avoidance Cockroaches are often found in the homes of densely populated urban areas, schools or commercial buildings, but these  creatures can lurk almost anywhere. This does not mean that you have a dirty house or living area. . Block all areas where roaches can enter the home. This includes crevices, wall cracks and windows.  . Cockroaches need water to survive, so fix and seal all leaky faucets and pipes. Have an exterminator go through the house when your family and pets are gone to eliminate any remaining roaches. Marland Kitchen Keep food in lidded containers and put pet food dishes  away after your pets are done eating. Vacuum and sweep the floor after meals, and take out garbage and recyclables. Use lidded garbage containers in the kitchen. Wash dishes immediately after use and clean under stoves, refrigerators or toasters where crumbs can accumulate. Wipe off the stove and other kitchen surfaces and cupboards regularly.

## 2020-04-25 NOTE — Assessment & Plan Note (Signed)
Perennial rhinitis symptoms for many years.  The last few years noting postnasal drip and voice changes with throat clearing.  Patient used to be on allergy injections as a child and as an adult with some benefit.  Evaluated by ENT in the past as well.  Tried over-the-counter antihistamines, Flonase, Dymista and Singulair with no benefit.  Denies heartburn/reflux symptoms.  Today's skin testing showed: Positive to grass, ragweed, trees, mold, dust mites, cat and cockroach. Borderline to weeds, horse. Negative dog, rabbit and [place] pigs.   Start environmental control measures as below.  Start Ryvent 1 tablet twice a day. Sample given if not covered let us know.  Continue Singulair (montelukast) 10mg  daily at night. Continue dymista (fluticasone + azelastine nasal spray combination) 1 spray per nostril twice a day.  Nasal saline spray (i.e., Simply Saline) or nasal saline lavage (i.e., NeilMed) is recommended as needed and prior to medicated nasal sprays.  Had a detailed discussion with patient/family that clinical history is suggestive of allergic rhinitis, and may benefit from allergy immunotherapy (AIT). Discussed in detail regarding the dosing, schedule, side effects (mild to moderate local allergic reaction and rarely systemic allergic reactions including anaphylaxis), and benefits (significant improvement in nasal symptoms, seasonal flares of asthma) of immunotherapy with the patient. There is significant time commitment involved with allergy shots, which includes weekly immunotherapy injections for first 9-12 months and then biweekly to monthly injections for 3-5 years.   Consent was signed.  Start allergy injections.   For mild symptoms you can take over the counter antihistamines such as Benadryl and monitor symptoms closely. If symptoms worsen or if you have severe symptoms including breathing issues, throat closure, significant swelling, whole body hives, severe diarrhea and vomiting,  lightheadedness then inject epinephrine and seek immediate medical care afterwards.  Action plan given.   Follow up with ENT as scheduled.

## 2020-04-26 DIAGNOSIS — J302 Other seasonal allergic rhinitis: Secondary | ICD-10-CM

## 2020-04-26 NOTE — Progress Notes (Signed)
VIALS EXP 07-06-20 

## 2020-04-27 ENCOUNTER — Telehealth: Payer: Self-pay

## 2020-04-27 DIAGNOSIS — J3089 Other allergic rhinitis: Secondary | ICD-10-CM

## 2020-04-27 MED ORDER — CARBINOXAMINE MALEATE 4 MG PO TABS: ORAL_TABLET | ORAL | 2 refills | Status: DC

## 2020-04-27 NOTE — Telephone Encounter (Signed)
Sent in generic carbinoxamine 4 mg tablet take 1 to 1 1/2 tablet twice a day as needed for allergies.

## 2020-04-27 NOTE — Telephone Encounter (Signed)
Received fax for an alternative request for Ryvent, it is not covered and will cost $430. Please advise.

## 2020-05-02 LAB — HM PAP SMEAR

## 2020-05-16 ENCOUNTER — Other Ambulatory Visit: Payer: Self-pay

## 2020-05-16 ENCOUNTER — Ambulatory Visit (INDEPENDENT_AMBULATORY_CARE_PROVIDER_SITE_OTHER): Payer: BC Managed Care – PPO

## 2020-05-16 DIAGNOSIS — J309 Allergic rhinitis, unspecified: Secondary | ICD-10-CM | POA: Diagnosis not present

## 2020-05-16 NOTE — Progress Notes (Signed)
Immunotherapy   Patient Details  Name: [name] [name] MRN: [id number] Date of Birth: [birth year]  05/16/2020  [name] here to start allergy injections. Patient received 0.05 of both her blue vials with an expiration of 04/26/2021. One with G-RW-W-T and the other with M-DM-C-CR. Patient waited 30 minutes with no problems. Following schedule: B Frequency: 1-2 times weekly Epi-Pen: yes Consent signed and patient instructions given.   Herbie Drape 05/16/2020, 1:59 PM

## 2020-05-23 ENCOUNTER — Other Ambulatory Visit: Payer: Self-pay

## 2020-05-23 ENCOUNTER — Ambulatory Visit (INDEPENDENT_AMBULATORY_CARE_PROVIDER_SITE_OTHER): Payer: BC Managed Care – PPO

## 2020-05-23 DIAGNOSIS — J309 Allergic rhinitis, unspecified: Secondary | ICD-10-CM

## 2020-05-30 ENCOUNTER — Ambulatory Visit (INDEPENDENT_AMBULATORY_CARE_PROVIDER_SITE_OTHER): Payer: BC Managed Care – PPO

## 2020-05-30 ENCOUNTER — Other Ambulatory Visit: Payer: Self-pay

## 2020-05-30 DIAGNOSIS — J309 Allergic rhinitis, unspecified: Secondary | ICD-10-CM | POA: Diagnosis not present

## 2020-06-01 ENCOUNTER — Ambulatory Visit (INDEPENDENT_AMBULATORY_CARE_PROVIDER_SITE_OTHER): Payer: BC Managed Care – PPO

## 2020-06-01 ENCOUNTER — Other Ambulatory Visit: Payer: Self-pay

## 2020-06-01 DIAGNOSIS — J309 Allergic rhinitis, unspecified: Secondary | ICD-10-CM | POA: Diagnosis not present

## 2020-06-06 ENCOUNTER — Ambulatory Visit (INDEPENDENT_AMBULATORY_CARE_PROVIDER_SITE_OTHER): Payer: BC Managed Care – PPO

## 2020-06-06 ENCOUNTER — Other Ambulatory Visit: Payer: Self-pay

## 2020-06-06 DIAGNOSIS — J309 Allergic rhinitis, unspecified: Secondary | ICD-10-CM

## 2020-06-08 ENCOUNTER — Other Ambulatory Visit: Payer: Self-pay

## 2020-06-08 ENCOUNTER — Ambulatory Visit (INDEPENDENT_AMBULATORY_CARE_PROVIDER_SITE_OTHER): Payer: BC Managed Care – PPO

## 2020-06-08 DIAGNOSIS — J309 Allergic rhinitis, unspecified: Secondary | ICD-10-CM | POA: Diagnosis not present

## 2020-06-13 ENCOUNTER — Other Ambulatory Visit: Payer: Self-pay

## 2020-06-13 ENCOUNTER — Ambulatory Visit (INDEPENDENT_AMBULATORY_CARE_PROVIDER_SITE_OTHER): Payer: BC Managed Care – PPO

## 2020-06-13 DIAGNOSIS — J309 Allergic rhinitis, unspecified: Secondary | ICD-10-CM | POA: Diagnosis not present

## 2020-06-15 ENCOUNTER — Ambulatory Visit (INDEPENDENT_AMBULATORY_CARE_PROVIDER_SITE_OTHER): Payer: BC Managed Care – PPO

## 2020-06-15 ENCOUNTER — Other Ambulatory Visit: Payer: Self-pay

## 2020-06-15 DIAGNOSIS — J309 Allergic rhinitis, unspecified: Secondary | ICD-10-CM

## 2020-06-20 ENCOUNTER — Ambulatory Visit (INDEPENDENT_AMBULATORY_CARE_PROVIDER_SITE_OTHER): Payer: BC Managed Care – PPO

## 2020-06-20 DIAGNOSIS — J309 Allergic rhinitis, unspecified: Secondary | ICD-10-CM

## 2020-06-22 ENCOUNTER — Ambulatory Visit (INDEPENDENT_AMBULATORY_CARE_PROVIDER_SITE_OTHER): Payer: BC Managed Care – PPO

## 2020-06-22 DIAGNOSIS — J309 Allergic rhinitis, unspecified: Secondary | ICD-10-CM | POA: Diagnosis not present

## 2020-06-26 ENCOUNTER — Other Ambulatory Visit: Payer: Self-pay

## 2020-06-26 ENCOUNTER — Ambulatory Visit (INDEPENDENT_AMBULATORY_CARE_PROVIDER_SITE_OTHER): Payer: BC Managed Care – PPO | Admitting: Family Medicine

## 2020-06-26 ENCOUNTER — Encounter: Payer: Self-pay | Admitting: Family Medicine

## 2020-06-26 VITALS — BP 101/76 | HR 112 | Temp 98.0°F | Resp 16 | Ht 66.0 in | Wt 135.5 lb

## 2020-06-26 DIAGNOSIS — G43809 Other migraine, not intractable, without status migrainosus: Secondary | ICD-10-CM

## 2020-06-26 DIAGNOSIS — Z130 Encounter for screening for diseases of the blood and blood-forming organs and certain disorders involving the immune mechanism: Secondary | ICD-10-CM

## 2020-06-26 DIAGNOSIS — Z0000 Encounter for general adult medical examination without abnormal findings: Secondary | ICD-10-CM

## 2020-06-26 DIAGNOSIS — E038 Other specified hypothyroidism: Secondary | ICD-10-CM

## 2020-06-26 DIAGNOSIS — E063 Autoimmune thyroiditis: Secondary | ICD-10-CM

## 2020-06-26 DIAGNOSIS — Z131 Encounter for screening for diabetes mellitus: Secondary | ICD-10-CM

## 2020-06-26 MED ORDER — ZOLMITRIPTAN 5 MG PO TABS
5.0000 mg | ORAL_TABLET | ORAL | 5 refills | Status: DC | PRN
Start: 2020-06-26 — End: 2020-06-26

## 2020-06-26 MED ORDER — ZOLMITRIPTAN 5 MG PO TABS: 5.0000 mg | ORAL_TABLET | ORAL | 5 refills | Status: DC | PRN

## 2020-06-26 NOTE — Patient Instructions (Addendum)
Health Maintenance, Female Adopting a healthy lifestyle and getting preventive care are important in promoting health and wellness. Ask your health care provider about:  The right schedule for you to have regular tests and exams.  Things you can do on your own to prevent diseases and keep yourself healthy. What should I know about diet, weight, and exercise? Eat a healthy diet   Eat a diet that includes plenty of vegetables, fruits, low-fat dairy products, and lean protein.  Do not eat a lot of foods that are high in solid fats, added sugars, or sodium. Maintain a healthy weight Body mass index (BMI) is used to identify weight problems. It estimates body fat based on height and weight. Your health care provider can help determine your BMI and help you achieve or maintain a healthy weight. Get regular exercise Get regular exercise. This is one of the most important things you can do for your health. Most adults should:  Exercise for at least 150 minutes each week. The exercise should increase your heart rate and make you sweat (moderate-intensity exercise).  Do strengthening exercises at least twice a week. This is in addition to the moderate-intensity exercise.  Spend less time sitting. Even light physical activity can be beneficial. Watch cholesterol and blood lipids Have your blood tested for lipids and cholesterol at 52 years of age, then have this test every 5 years. Have your cholesterol levels checked more often if:  Your lipid or cholesterol levels are high.  You are older than 52 years of age.  You are at high risk for heart disease. What should I know about cancer screening? Depending on your health history and family history, you may need to have cancer screening at various ages. This may include screening for:  Breast cancer.  Cervical cancer.  Colorectal cancer.  Skin cancer.  Lung cancer. What should I know about heart disease, diabetes, and high blood  pressure? Blood pressure and heart disease  High blood pressure causes heart disease and increases the risk of stroke. This is more likely to develop in people who have high blood pressure readings, are of African descent, or are overweight.  Have your blood pressure checked: ? Every 3-5 years if you are 18-39 years of age. ? Every year if you are 40 years old or older. Diabetes Have regular diabetes screenings. This checks your fasting blood sugar level. Have the screening done:  Once every three years after age 40 if you are at a normal weight and have a low risk for diabetes.  More often and at a younger age if you are overweight or have a high risk for diabetes. What should I know about preventing infection? Hepatitis B If you have a higher risk for hepatitis B, you should be screened for this virus. Talk with your health care provider to find out if you are at risk for hepatitis B infection. Hepatitis C Testing is recommended for:  Everyone born from 1945 through 1965.  Anyone with known risk factors for hepatitis C. Sexually transmitted infections (STIs)  Get screened for STIs, including gonorrhea and chlamydia, if: ? You are sexually active and are younger than 52 years of age. ? You are older than 52 years of age and your health care provider tells you that you are at risk for this type of infection. ? Your sexual activity has changed since you were last screened, and you are at increased risk for chlamydia or gonorrhea. Ask your health care provider if   you are at risk.  Ask your health care provider about whether you are at high risk for HIV. Your health care provider may recommend a prescription medicine to help prevent HIV infection. If you choose to take medicine to prevent HIV, you should first get tested for HIV. You should then be tested every 3 months for as long as you are taking the medicine. Pregnancy  If you are about to stop having your period (premenopausal) and  you may become pregnant, seek counseling before you get pregnant.  Take 400 to 800 micrograms (mcg) of folic acid every day if you become pregnant.  Ask for birth control (contraception) if you want to prevent pregnancy. Osteoporosis and menopause Osteoporosis is a disease in which the bones lose minerals and strength with aging. This can result in bone fractures. If you are 92 years old or older, or if you are at risk for osteoporosis and fractures, ask your health care provider if you should:  Be screened for bone loss.  Take a calcium or vitamin D supplement to lower your risk of fractures.  Be given hormone replacement therapy (HRT) to treat symptoms of menopause. Follow these instructions at home: Lifestyle  Do not use any products that contain nicotine or tobacco, such as cigarettes, e-cigarettes, and chewing tobacco. If you need help quitting, ask your health care provider.  Do not use street drugs.  Do not share needles.  Ask your health care provider for help if you need support or information about quitting drugs. Alcohol use  Do not drink alcohol if: ? Your health care provider tells you not to drink. ? You are pregnant, may be pregnant, or are planning to become pregnant.  If you drink alcohol: ? Limit how much you use to 0-1 drink a day. ? Limit intake if you are breastfeeding.  Be aware of how much alcohol is in your drink. In the [place] one drink equals one 12 oz bottle of beer (355 mL), one 5 oz glass of wine (148 mL), or one 1 oz glass of hard liquor (44 mL). General instructions  Schedule regular health, dental, and eye exams.  Stay current with your vaccines.  Tell your health care provider if: ? You often feel depressed. ? You have ever been abused or do not feel safe at home. Summary  Adopting a healthy lifestyle and getting preventive care are important in promoting health and wellness.  Follow your health care provider's instructions about healthy  diet, exercising, and getting tested or screened for diseases.  Follow your health care provider's instructions on monitoring your cholesterol and blood pressure. This information is not intended to replace advice given to you by your health care provider. Make sure you discuss any questions you have with your health care provider. Document Revised: 06/03/2018 Document Reviewed: 06/03/2018 Elsevier Patient Education  [employer].   If you are age 37 or older, your body mass index should be between 23-30. Your Body mass index is Body mass index is 21.87 kg/m.Marland Kitchen If this is above the aforementioned range listed, you are consider obese by medical definition and standards. Routine daily exercise and dietary modifications are encouraged. If you would like additional guidance on weight loss, please make an appointment for weight loss counseling - must be an appointment dedicate to weight loss counseling alone. I would be happy to help you.   If you are age 87 or younger, your body mass index should be between 19-25. Your Body mass index  is Body mass index is 21.87 kg/m. Marland Kitchen If this is above the aformentioned range listed, you are consider obese by medical definition and standards. Routine daily exercise and dietary modifications are encouraged. If you would like additional guidance on weight loss, please make an appointment for weight loss counseling - must be an appointment dedicate to weight loss counseling alone. I would be happy to help you.

## 2020-06-26 NOTE — Progress Notes (Signed)
This visit occurred during the SARS-CoV-2 public health emergency.  Safety protocols were in place, including screening questions prior to the visit, additional usage of staff PPE, and extensive cleaning of exam room while observing appropriate contact time as indicated for disinfecting solutions.    Patient ID: [name] [name], female  DOB: [birth year], 50 y.o.   MRN: [id number] Patient Care Team    Relationship Specialty Notifications Start End  [name], DO PCP - General Family Medicine  08/23/19   Fay Records, MD Consulting Physician Cardiology  08/23/19   Arta Silence, MD Consulting Physician Gastroenterology  08/23/19   Otelia Sergeant, OD Referring Physician   08/23/19   Jenelle Mages, MD Referring Physician Endocrinology  08/23/19     Chief Complaint  Patient presents with  . Annual Exam    Subjective: [name] [name] is a 50 y.o.  Female  present for CPE. All past medical history, surgical history, allergies, family history, immunizations, medications and social history were updated in the electronic medical record today. All recent labs, ED visits and hospitalizations within the last year were reviewed.  Health maintenance:  Colonoscopy: completed 02/23/2020- 10 yrs, by Dr. Paulita Fujita Mammogram: completed: 01/18/2020, birads Dr. Paula Compton Cervical cancer screening: last pap: [facility] Plan- UTD Immunizations: tdap 11/2019UTD, Influenza 02/2020 (encouraged yearly), COVID series and booster completed, reports shingrix series at [facility]. Infectious disease screening: HIV and Hep C declined DEXA: routine  Assistive device: none Oxygen NVB:TYOM Patient has a Dental home. Hospitalizations/ED visits: reviewed  migraine without status migrainosus, not intractable Patient reports she has a chronic migraine history.  Over the years she typically averages about 3 migraines a month.  She uses Zomig as needed for her migraines- they have been bad this week  secondary to weather.  Hypothyroidism due to Hashimoto's thyroiditis Patient is followed by endocrinology for her condition.    Depression screen [facility] 2/9 08/20/2019  Decreased Interest 0  Down, Depressed, Hopeless 0  PHQ - 2 Score 0   No flowsheet data found.  Immunization History  Administered Date(s) Administered  . Hepatitis A 04/03/2018  . Hepatitis A, Adult 04/03/2018  . Hepatitis B 04/03/2018, 06/04/2018  . Hepatitis B, adult 04/03/2018, 06/04/2018  . Influenza Inj Mdck Quad Pf 02/15/2018  . Influenza Split 02/14/2017  . Influenza,inj,Quad PF,6+ Mos 02/23/2020  . Influenza-Unspecified 02/23/2019  . Rho (D) Immune Globulin 08/14/2011  . Td 01/22/2009  . Tdap 04/03/2018  . Typhoid Live 04/06/2018    Past Medical History:  Diagnosis Date  . Angio-edema   . Chicken pox   . Closed displaced fracture of right great toe 09/01/2018  . Constipation   . Fibromyalgia   . Hashimoto's disease   . Hyperlipidemia   . Hypothyroidism   . IBS (irritable bowel syndrome)   . Menopausal syndrome   . Migraines   . PONV (postoperative nausea and vomiting)   . 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 Seasonal allergies   . Urticaria    Allergies  Allergen Reactions  . Thimerosal Hives  . Pneumococcal Vaccine Hives, Itching, Nausea Only and Rash   Past Surgical History:  Procedure Laterality Date  . COLONOSCOPY  2016  . DILATION AND CURETTAGE OF UTERUS  2006   missed ab  . DILATION AND EVACUATION  08/14/2011   Procedure: DILATATION AND EVACUATION;  Surgeon: Logan Bores, MD;  Location: [facility];  Service: Gynecology;  Laterality: N/A;  . WISDOM TOOTH EXTRACTION  Family History  Problem Relation Age of Onset  . Hypertension Mother   . Arthritis Mother   . Depression Mother   . Diabetes Mother   . Hyperlipidemia Mother   . Mental illness Mother   . Hypertension Father   . Early death Father 19  . Anuerysm Father 14       brain  . Depression Brother   .  Mental illness Brother   . Asthma Maternal Grandmother   . Depression Maternal Grandmother   . Heart disease Maternal Grandmother   . Depression Paternal Grandfather   . Asthma Daughter   . Allergic rhinitis Neg Hx   . Eczema Neg Hx   . Urticaria Neg Hx    Social History   Social History Narrative   Marital status/children/pets: Married.  5 children.   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   Safety:      -smoke alarm in the home:Yes     - wears seatbelt: Yes     - Feels safe in their relationships: Yes       Allergies as of 06/26/2020      Reactions   Thimerosal Hives   Pneumococcal Vaccine Hives, Itching, Nausea Only, Rash      Medication List       Accurate as of June 26, 2020  8:36 AM. If you have any questions, ask your nurse or doctor.        STOP taking these medications   Azelastine-Fluticasone 137-50 MCG/ACT Susp Commonly known as: Dymista Stopped by: [name], DO   Carbinoxamine Maleate 4 MG Tabs Stopped by: [name], DO   hydrochlorothiazide 25 MG tablet Commonly known as: HYDRODIURIL Stopped by: [name], DO   montelukast 10 MG tablet Commonly known as: SINGULAIR Stopped by: [name], DO   selenium 50 MCG Tabs tablet Stopped by: [name], DO   traZODone 50 MG tablet Commonly known as: DESYREL Stopped by: [name], DO     TAKE these medications   aspirin EC 81 MG tablet Take 81 mg by mouth as needed.   hydrOXYzine 10 MG tablet Commonly known as: ATARAX/VISTARIL TAKE 2-3 TABLETS (20-30 MG TOTAL) BY MOUTH AT BEDTIME.   PRESCRIPTION MEDICATION Take 1 capsule by mouth daily. Levothyroxine 239mg, Liothyronine 188m Slow Release Capsule (40)   zolmitriptan 5 MG tablet Commonly known as: ZOMIG Take 1 tablet (5 mg total) by mouth as needed.       All past medical history, surgical history, allergies, family history, immunizations andmedications were updated in the EMR today and reviewed  under the history and medication portions of their EMR.     No results found for this or any previous visit (from the past 2160 hour(s)).  MM 3D SCREEN BREAST BILATERAL  Result Date: 01/20/2020 CLINICAL DATA:  Screening. EXAM: DIGITAL SCREENING BILATERAL MAMMOGRAM WITH TOMO AND CAD COMPARISON:  Previous exam(s). ACR Breast Density Category d: The breast tissue is extremely dense, which lowers the sensitivity of mammography FINDINGS: There are no findings suspicious for malignancy. Images were processed with CAD. IMPRESSION: No mammographic evidence of malignancy. A result letter of this screening mammogram will be mailed directly to the patient. RECOMMENDATION: Screening mammogram in one year. (Code:SM-B-01Y) BI-RADS CATEGORY  1: Negative. Electronically Signed   By: JeEverlean Alstrom.D.   On: 01/20/2020 08:51     ROS: 14 pt review of systems performed and negative (unless mentioned in an HPI)  Objective: BP 101/76 (BP Location: Left Arm, Patient  Position: Sitting, Cuff Size: Normal)   Pulse (!) 112   Temp 98 F (36.7 C) (Oral)   Resp 16   Ht _0  (1.676 m)   Wt 135 lb 8 oz (61.5 kg)   SpO2 98%   BMI 21.87 kg/m  Gen: Afebrile. No acute distress. Nontoxic in appearance, well-developed, well-nourished,  Pleasant female.  HENT: AT. [place]. Bilateral TM visualized and normal in appearance, normal external auditory canal. MMM, no oral lesions, adequate dentition. Bilateral nares within normal limits. Throat without erythema, ulcerations or exudates. no Cough on exam, no hoarseness on exam. Eyes:Pupils Equal Round Reactive to light, Extraocular movements intact,  Conjunctiva without redness, discharge or icterus. Neck/lymp/endocrine: Supple,no lymphadenopathy, no thyromegaly CV: RRR no murmur, no edema, +2/4 P posterior tibialis pulses.  Chest: CTAB, no wheeze, rhonchi or crackles. normal Respiratory effort. good Air movement. Abd: Soft. flat. NTND. BS present. no Masses palpated. No  hepatosplenomegaly. No rebound tenderness or guarding. Skin: no rashes, purpura or petechiae. Warm and well-perfused. Skin intact. Neuro/Msk: Normal gait. PERLA. EOMi. Alert. Oriented x3.  Cranial nerves II through XII intact. Muscle strength 5/5 upper/lower extremity. DTRs equal bilaterally. Psych: Normal affect, dress and demeanor. Normal speech. Normal thought content and judgment.   No exam data present  Assessment/plan: CHONG JANUARY is a 50 y.o. female present for CPE migraine without status migrainosus, not intractable/vegetarian/sleep disturbance/family history of brain aneurysm in her father Encourage patient to start B12 over-the-counter supplementation and magnesium. Continue Zomig as needed Vistaril PRN- has not used yet Family history of brain aneurysm in her father, low threshold of imaging if not done prior.  Hypothyroidism due to Hashimoto's thyroiditis Follows with endocrinology.  We will continue to follow with endocrinology.  Currently uses a compounded medication. Recent labs normal per pt.  Lipid panel- future (nl lab personal available) - Comp Met (CMET); Future  Screening for deficiency anemia - CBC w/Diff; Future  Diabetes mellitus screening - Hemoglobin A1c; Future   Routine general medical examination at a health care facility Patient was encouraged to exercise greater than 150 minutes a week. Patient was encouraged to choose a diet filled with fresh fruits and vegetables, and lean meats. AVS provided to patient today for education/recommendation on gender specific health and safety maintenance. Colonoscopy: completed 02/23/2020- 10 yrs, by Dr. Paulita Fujita Mammogram: completed: 01/18/2020, birads Dr. Paula Compton Cervical cancer screening: last pap: [facility] Plan- UTD Immunizations: tdap 11/2019UTD, Influenza 02/2020 (encouraged yearly), COVID series and booster completed, reports shingrix series at [facility]. Infectious disease screening: HIV and Hep C  declined  Return in about 1 year (around 06/26/2021) for CPE (30 min).  Orders Placed This Encounter  Procedures  . CBC w/Diff  . Comp Met (CMET)  . Lipid panel  . Hemoglobin A1c    Orders Placed This Encounter  Procedures  . CBC w/Diff  . Comp Met (CMET)  . Lipid panel  . Hemoglobin A1c   Meds ordered this encounter  Medications  . DISCONTD: zolmitriptan (ZOMIG) 5 MG tablet    Sig: Take 1 tablet (5 mg total) by mouth as needed.    Dispense:  10 tablet    Refill:  5    Hold until pt request  . zolmitriptan (ZOMIG) 5 MG tablet    Sig: Take 1 tablet (5 mg total) by mouth as needed.    Dispense:  10 tablet    Refill:  5   Referral Orders  No referral(s) requested today     Electronically signed by:  [name], DO [place]

## 2020-07-04 ENCOUNTER — Other Ambulatory Visit: Payer: Self-pay

## 2020-07-04 ENCOUNTER — Ambulatory Visit (INDEPENDENT_AMBULATORY_CARE_PROVIDER_SITE_OTHER): Payer: BC Managed Care – PPO

## 2020-07-04 DIAGNOSIS — J309 Allergic rhinitis, unspecified: Secondary | ICD-10-CM | POA: Diagnosis not present

## 2020-07-06 ENCOUNTER — Ambulatory Visit (INDEPENDENT_AMBULATORY_CARE_PROVIDER_SITE_OTHER): Payer: BC Managed Care – PPO

## 2020-07-06 ENCOUNTER — Other Ambulatory Visit: Payer: Self-pay

## 2020-07-06 DIAGNOSIS — E063 Autoimmune thyroiditis: Secondary | ICD-10-CM | POA: Diagnosis not present

## 2020-07-06 DIAGNOSIS — Z130 Encounter for screening for diseases of the blood and blood-forming organs and certain disorders involving the immune mechanism: Secondary | ICD-10-CM | POA: Diagnosis not present

## 2020-07-06 DIAGNOSIS — J309 Allergic rhinitis, unspecified: Secondary | ICD-10-CM

## 2020-07-06 DIAGNOSIS — E038 Other specified hypothyroidism: Secondary | ICD-10-CM | POA: Diagnosis not present

## 2020-07-06 DIAGNOSIS — Z131 Encounter for screening for diabetes mellitus: Secondary | ICD-10-CM

## 2020-07-06 LAB — CBC WITH DIFFERENTIAL/PLATELET
Basophils Absolute: 0 10*3/uL (ref 0.0–0.1)
Basophils Relative: 0.9 % (ref 0.0–3.0)
Eosinophils Absolute: 0.2 10*3/uL (ref 0.0–0.7)
Eosinophils Relative: 3.5 % (ref 0.0–5.0)
HCT: 41.4 % (ref 36.0–46.0)
Hemoglobin: 13.9 g/dL (ref 12.0–15.0)
Lymphocytes Relative: 35.6 % (ref 12.0–46.0)
Lymphs Abs: 1.6 10*3/uL (ref 0.7–4.0)
MCHC: 33.7 g/dL (ref 30.0–36.0)
MCV: 90.7 fl (ref 78.0–100.0)
Monocytes Absolute: 0.4 10*3/uL (ref 0.1–1.0)
Monocytes Relative: 10.2 % (ref 3.0–12.0)
Neutro Abs: 2.2 10*3/uL (ref 1.4–7.7)
Neutrophils Relative %: 49.8 % (ref 43.0–77.0)
Platelets: 234 10*3/uL (ref 150.0–400.0)
RBC: 4.56 Mil/uL (ref 3.87–5.11)
RDW: 12.8 % (ref 11.5–15.5)
WBC: 4.3 10*3/uL (ref 4.0–10.5)

## 2020-07-06 LAB — LIPID PANEL
Cholesterol: 224 mg/dL — ABNORMAL HIGH (ref 0–200)
HDL: 71.1 mg/dL (ref >39.00)
LDL Cholesterol: 139 mg/dL — ABNORMAL HIGH (ref 0–99)
NonHDL: 153.19
Total CHOL/HDL Ratio: 3
Triglycerides: 71 mg/dL (ref 0.0–149.0)
VLDL: 14.2 mg/dL (ref 0.0–40.0)

## 2020-07-06 LAB — COMPREHENSIVE METABOLIC PANEL
ALT: 12 U/L (ref 0–35)
AST: 14 U/L (ref 0–37)
Albumin: 4.4 g/dL (ref 3.5–5.2)
Alkaline Phosphatase: 40 U/L (ref 39–117)
BUN: 13 mg/dL (ref 6–23)
CO2: 27 mEq/L (ref 19–32)
Calcium: 9.4 mg/dL (ref 8.4–10.5)
Chloride: 106 mEq/L (ref 96–112)
Creatinine, Ser: 0.73 mg/dL (ref 0.40–1.20)
GFR: 95.05 mL/min (ref >60.00)
Glucose, Bld: 88 mg/dL (ref 70–99)
Potassium: 4.2 mEq/L (ref 3.5–5.1)
Sodium: 139 mEq/L (ref 135–145)
Total Bilirubin: 0.4 mg/dL (ref 0.2–1.2)
Total Protein: 7 g/dL (ref 6.0–8.3)

## 2020-07-06 LAB — HEMOGLOBIN A1C: Hgb A1c MFr Bld: 5.9 % (ref 4.6–6.5)

## 2020-07-07 ENCOUNTER — Ambulatory Visit: Payer: BC Managed Care – PPO

## 2020-07-11 ENCOUNTER — Other Ambulatory Visit: Payer: Self-pay

## 2020-07-11 ENCOUNTER — Ambulatory Visit (INDEPENDENT_AMBULATORY_CARE_PROVIDER_SITE_OTHER): Payer: BC Managed Care – PPO

## 2020-07-11 DIAGNOSIS — J309 Allergic rhinitis, unspecified: Secondary | ICD-10-CM

## 2020-07-13 ENCOUNTER — Other Ambulatory Visit: Payer: Self-pay

## 2020-07-13 ENCOUNTER — Ambulatory Visit (INDEPENDENT_AMBULATORY_CARE_PROVIDER_SITE_OTHER): Payer: BC Managed Care – PPO

## 2020-07-13 DIAGNOSIS — J309 Allergic rhinitis, unspecified: Secondary | ICD-10-CM

## 2020-07-18 ENCOUNTER — Other Ambulatory Visit: Payer: Self-pay

## 2020-07-18 ENCOUNTER — Ambulatory Visit (INDEPENDENT_AMBULATORY_CARE_PROVIDER_SITE_OTHER): Payer: BC Managed Care – PPO

## 2020-07-18 DIAGNOSIS — J309 Allergic rhinitis, unspecified: Secondary | ICD-10-CM | POA: Diagnosis not present

## 2020-07-20 ENCOUNTER — Ambulatory Visit (INDEPENDENT_AMBULATORY_CARE_PROVIDER_SITE_OTHER): Payer: BC Managed Care – PPO

## 2020-07-20 ENCOUNTER — Other Ambulatory Visit: Payer: Self-pay

## 2020-07-20 DIAGNOSIS — J309 Allergic rhinitis, unspecified: Secondary | ICD-10-CM

## 2020-07-25 ENCOUNTER — Other Ambulatory Visit: Payer: Self-pay

## 2020-07-25 ENCOUNTER — Ambulatory Visit (INDEPENDENT_AMBULATORY_CARE_PROVIDER_SITE_OTHER): Payer: BC Managed Care – PPO

## 2020-07-25 DIAGNOSIS — J309 Allergic rhinitis, unspecified: Secondary | ICD-10-CM | POA: Diagnosis not present

## 2020-07-27 ENCOUNTER — Other Ambulatory Visit: Payer: Self-pay

## 2020-07-27 ENCOUNTER — Ambulatory Visit: Payer: BC Managed Care – PPO | Admitting: Allergy

## 2020-07-27 ENCOUNTER — Ambulatory Visit (INDEPENDENT_AMBULATORY_CARE_PROVIDER_SITE_OTHER): Payer: BC Managed Care – PPO

## 2020-07-27 DIAGNOSIS — J309 Allergic rhinitis, unspecified: Secondary | ICD-10-CM

## 2020-08-01 ENCOUNTER — Ambulatory Visit (INDEPENDENT_AMBULATORY_CARE_PROVIDER_SITE_OTHER): Payer: BC Managed Care – PPO

## 2020-08-01 ENCOUNTER — Other Ambulatory Visit: Payer: Self-pay

## 2020-08-01 DIAGNOSIS — J309 Allergic rhinitis, unspecified: Secondary | ICD-10-CM | POA: Diagnosis not present

## 2020-08-08 ENCOUNTER — Other Ambulatory Visit: Payer: Self-pay

## 2020-08-08 ENCOUNTER — Ambulatory Visit (INDEPENDENT_AMBULATORY_CARE_PROVIDER_SITE_OTHER): Payer: BC Managed Care – PPO

## 2020-08-08 DIAGNOSIS — J309 Allergic rhinitis, unspecified: Secondary | ICD-10-CM | POA: Diagnosis not present

## 2020-08-15 ENCOUNTER — Ambulatory Visit (INDEPENDENT_AMBULATORY_CARE_PROVIDER_SITE_OTHER): Payer: BC Managed Care – PPO

## 2020-08-15 ENCOUNTER — Other Ambulatory Visit: Payer: Self-pay

## 2020-08-15 DIAGNOSIS — J309 Allergic rhinitis, unspecified: Secondary | ICD-10-CM | POA: Diagnosis not present

## 2020-08-22 ENCOUNTER — Other Ambulatory Visit: Payer: Self-pay

## 2020-08-22 ENCOUNTER — Ambulatory Visit (INDEPENDENT_AMBULATORY_CARE_PROVIDER_SITE_OTHER): Payer: BC Managed Care – PPO

## 2020-08-22 DIAGNOSIS — J309 Allergic rhinitis, unspecified: Secondary | ICD-10-CM

## 2020-08-24 ENCOUNTER — Telehealth: Payer: Self-pay

## 2020-08-24 NOTE — Telephone Encounter (Signed)
A user error has taken place: orders placed in error, not carried out on this patient.

## 2020-08-29 ENCOUNTER — Other Ambulatory Visit: Payer: Self-pay

## 2020-08-29 ENCOUNTER — Ambulatory Visit (INDEPENDENT_AMBULATORY_CARE_PROVIDER_SITE_OTHER): Payer: BC Managed Care – PPO

## 2020-08-29 DIAGNOSIS — J309 Allergic rhinitis, unspecified: Secondary | ICD-10-CM

## 2020-09-05 ENCOUNTER — Other Ambulatory Visit: Payer: Self-pay

## 2020-09-05 ENCOUNTER — Ambulatory Visit (INDEPENDENT_AMBULATORY_CARE_PROVIDER_SITE_OTHER): Payer: BC Managed Care – PPO

## 2020-09-05 DIAGNOSIS — J309 Allergic rhinitis, unspecified: Secondary | ICD-10-CM

## 2020-09-14 ENCOUNTER — Other Ambulatory Visit: Payer: Self-pay

## 2020-09-14 ENCOUNTER — Ambulatory Visit (INDEPENDENT_AMBULATORY_CARE_PROVIDER_SITE_OTHER): Payer: BC Managed Care – PPO

## 2020-09-14 DIAGNOSIS — J309 Allergic rhinitis, unspecified: Secondary | ICD-10-CM | POA: Diagnosis not present

## 2020-09-19 ENCOUNTER — Ambulatory Visit (INDEPENDENT_AMBULATORY_CARE_PROVIDER_SITE_OTHER): Payer: BC Managed Care – PPO

## 2020-09-19 ENCOUNTER — Other Ambulatory Visit: Payer: Self-pay

## 2020-09-19 DIAGNOSIS — J309 Allergic rhinitis, unspecified: Secondary | ICD-10-CM

## 2020-09-26 ENCOUNTER — Other Ambulatory Visit: Payer: Self-pay

## 2020-09-26 ENCOUNTER — Ambulatory Visit (INDEPENDENT_AMBULATORY_CARE_PROVIDER_SITE_OTHER): Payer: BC Managed Care – PPO

## 2020-09-26 DIAGNOSIS — J309 Allergic rhinitis, unspecified: Secondary | ICD-10-CM | POA: Diagnosis not present

## 2020-09-26 NOTE — Progress Notes (Signed)
VIALS EXP 09-26-21

## 2020-09-27 DIAGNOSIS — J3089 Other allergic rhinitis: Secondary | ICD-10-CM

## 2020-10-03 ENCOUNTER — Ambulatory Visit (INDEPENDENT_AMBULATORY_CARE_PROVIDER_SITE_OTHER): Payer: BC Managed Care – PPO

## 2020-10-03 ENCOUNTER — Other Ambulatory Visit: Payer: Self-pay

## 2020-10-03 DIAGNOSIS — J309 Allergic rhinitis, unspecified: Secondary | ICD-10-CM | POA: Diagnosis not present

## 2020-10-12 ENCOUNTER — Other Ambulatory Visit: Payer: Self-pay

## 2020-10-12 ENCOUNTER — Ambulatory Visit (INDEPENDENT_AMBULATORY_CARE_PROVIDER_SITE_OTHER): Payer: BC Managed Care – PPO

## 2020-10-12 DIAGNOSIS — J309 Allergic rhinitis, unspecified: Secondary | ICD-10-CM | POA: Diagnosis not present

## 2020-10-17 ENCOUNTER — Other Ambulatory Visit: Payer: Self-pay

## 2020-10-17 ENCOUNTER — Ambulatory Visit (INDEPENDENT_AMBULATORY_CARE_PROVIDER_SITE_OTHER): Payer: BC Managed Care – PPO

## 2020-10-17 DIAGNOSIS — J309 Allergic rhinitis, unspecified: Secondary | ICD-10-CM | POA: Diagnosis not present

## 2020-10-24 ENCOUNTER — Ambulatory Visit (INDEPENDENT_AMBULATORY_CARE_PROVIDER_SITE_OTHER): Payer: BC Managed Care – PPO

## 2020-10-24 ENCOUNTER — Other Ambulatory Visit: Payer: Self-pay

## 2020-10-24 DIAGNOSIS — J309 Allergic rhinitis, unspecified: Secondary | ICD-10-CM | POA: Diagnosis not present

## 2020-11-02 ENCOUNTER — Ambulatory Visit (INDEPENDENT_AMBULATORY_CARE_PROVIDER_SITE_OTHER): Payer: BC Managed Care – PPO

## 2020-11-02 ENCOUNTER — Other Ambulatory Visit: Payer: Self-pay

## 2020-11-02 DIAGNOSIS — J309 Allergic rhinitis, unspecified: Secondary | ICD-10-CM

## 2020-11-14 ENCOUNTER — Other Ambulatory Visit: Payer: Self-pay

## 2020-11-14 ENCOUNTER — Ambulatory Visit (INDEPENDENT_AMBULATORY_CARE_PROVIDER_SITE_OTHER): Payer: BC Managed Care – PPO

## 2020-11-14 DIAGNOSIS — J309 Allergic rhinitis, unspecified: Secondary | ICD-10-CM | POA: Diagnosis not present

## 2020-11-21 ENCOUNTER — Other Ambulatory Visit: Payer: Self-pay

## 2020-11-21 ENCOUNTER — Ambulatory Visit (INDEPENDENT_AMBULATORY_CARE_PROVIDER_SITE_OTHER): Payer: BC Managed Care – PPO

## 2020-11-21 DIAGNOSIS — J309 Allergic rhinitis, unspecified: Secondary | ICD-10-CM

## 2020-11-30 ENCOUNTER — Other Ambulatory Visit: Payer: Self-pay

## 2020-11-30 ENCOUNTER — Ambulatory Visit (INDEPENDENT_AMBULATORY_CARE_PROVIDER_SITE_OTHER): Payer: BC Managed Care – PPO

## 2020-11-30 DIAGNOSIS — J309 Allergic rhinitis, unspecified: Secondary | ICD-10-CM | POA: Diagnosis not present

## 2020-12-05 ENCOUNTER — Other Ambulatory Visit: Payer: Self-pay

## 2020-12-05 ENCOUNTER — Ambulatory Visit (INDEPENDENT_AMBULATORY_CARE_PROVIDER_SITE_OTHER): Payer: BC Managed Care – PPO

## 2020-12-05 DIAGNOSIS — J309 Allergic rhinitis, unspecified: Secondary | ICD-10-CM

## 2020-12-12 ENCOUNTER — Other Ambulatory Visit: Payer: Self-pay

## 2020-12-12 ENCOUNTER — Ambulatory Visit (INDEPENDENT_AMBULATORY_CARE_PROVIDER_SITE_OTHER): Payer: BC Managed Care – PPO

## 2020-12-12 DIAGNOSIS — J309 Allergic rhinitis, unspecified: Secondary | ICD-10-CM

## 2020-12-19 ENCOUNTER — Other Ambulatory Visit: Payer: Self-pay

## 2020-12-19 ENCOUNTER — Ambulatory Visit (INDEPENDENT_AMBULATORY_CARE_PROVIDER_SITE_OTHER): Payer: BC Managed Care – PPO

## 2020-12-19 DIAGNOSIS — J309 Allergic rhinitis, unspecified: Secondary | ICD-10-CM

## 2020-12-26 ENCOUNTER — Ambulatory Visit (INDEPENDENT_AMBULATORY_CARE_PROVIDER_SITE_OTHER): Payer: BC Managed Care – PPO

## 2020-12-26 DIAGNOSIS — J309 Allergic rhinitis, unspecified: Secondary | ICD-10-CM

## 2020-12-27 DIAGNOSIS — J3089 Other allergic rhinitis: Secondary | ICD-10-CM

## 2020-12-27 NOTE — Progress Notes (Signed)
VIALS MADE. EXP 12-27-21 

## 2021-01-02 ENCOUNTER — Other Ambulatory Visit: Payer: Self-pay

## 2021-01-02 ENCOUNTER — Ambulatory Visit (INDEPENDENT_AMBULATORY_CARE_PROVIDER_SITE_OTHER): Payer: BC Managed Care – PPO

## 2021-01-02 DIAGNOSIS — J309 Allergic rhinitis, unspecified: Secondary | ICD-10-CM | POA: Diagnosis not present

## 2021-01-08 ENCOUNTER — Other Ambulatory Visit: Payer: Self-pay | Admitting: Family Medicine

## 2021-01-09 ENCOUNTER — Ambulatory Visit (INDEPENDENT_AMBULATORY_CARE_PROVIDER_SITE_OTHER): Payer: BC Managed Care – PPO

## 2021-01-09 ENCOUNTER — Other Ambulatory Visit: Payer: Self-pay

## 2021-01-09 DIAGNOSIS — J309 Allergic rhinitis, unspecified: Secondary | ICD-10-CM

## 2021-01-18 ENCOUNTER — Ambulatory Visit (INDEPENDENT_AMBULATORY_CARE_PROVIDER_SITE_OTHER): Payer: BC Managed Care – PPO | Admitting: *Deleted

## 2021-01-18 DIAGNOSIS — J309 Allergic rhinitis, unspecified: Secondary | ICD-10-CM | POA: Diagnosis not present

## 2021-01-30 ENCOUNTER — Other Ambulatory Visit: Payer: Self-pay

## 2021-01-30 ENCOUNTER — Ambulatory Visit (INDEPENDENT_AMBULATORY_CARE_PROVIDER_SITE_OTHER): Payer: BC Managed Care – PPO

## 2021-01-30 DIAGNOSIS — J309 Allergic rhinitis, unspecified: Secondary | ICD-10-CM | POA: Diagnosis not present

## 2021-02-06 ENCOUNTER — Other Ambulatory Visit: Payer: Self-pay

## 2021-02-06 ENCOUNTER — Ambulatory Visit (INDEPENDENT_AMBULATORY_CARE_PROVIDER_SITE_OTHER): Payer: BC Managed Care – PPO

## 2021-02-06 DIAGNOSIS — J309 Allergic rhinitis, unspecified: Secondary | ICD-10-CM

## 2021-02-13 ENCOUNTER — Ambulatory Visit (INDEPENDENT_AMBULATORY_CARE_PROVIDER_SITE_OTHER): Payer: BC Managed Care – PPO

## 2021-02-13 ENCOUNTER — Other Ambulatory Visit: Payer: Self-pay

## 2021-02-13 DIAGNOSIS — J309 Allergic rhinitis, unspecified: Secondary | ICD-10-CM

## 2021-02-20 ENCOUNTER — Other Ambulatory Visit: Payer: Self-pay

## 2021-02-20 ENCOUNTER — Ambulatory Visit (INDEPENDENT_AMBULATORY_CARE_PROVIDER_SITE_OTHER): Payer: BC Managed Care – PPO

## 2021-02-20 ENCOUNTER — Other Ambulatory Visit: Payer: Self-pay | Admitting: Obstetrics and Gynecology

## 2021-02-20 DIAGNOSIS — J309 Allergic rhinitis, unspecified: Secondary | ICD-10-CM

## 2021-02-20 DIAGNOSIS — Z1231 Encounter for screening mammogram for malignant neoplasm of breast: Secondary | ICD-10-CM

## 2021-02-23 ENCOUNTER — Ambulatory Visit: Payer: BC Managed Care – PPO

## 2021-03-06 ENCOUNTER — Ambulatory Visit (INDEPENDENT_AMBULATORY_CARE_PROVIDER_SITE_OTHER): Payer: BC Managed Care – PPO

## 2021-03-06 ENCOUNTER — Other Ambulatory Visit: Payer: Self-pay

## 2021-03-06 DIAGNOSIS — J309 Allergic rhinitis, unspecified: Secondary | ICD-10-CM

## 2021-03-20 ENCOUNTER — Ambulatory Visit (INDEPENDENT_AMBULATORY_CARE_PROVIDER_SITE_OTHER): Payer: BC Managed Care – PPO

## 2021-03-20 ENCOUNTER — Other Ambulatory Visit: Payer: Self-pay

## 2021-03-20 DIAGNOSIS — J309 Allergic rhinitis, unspecified: Secondary | ICD-10-CM | POA: Diagnosis not present

## 2021-03-30 ENCOUNTER — Ambulatory Visit: Payer: BC Managed Care – PPO

## 2021-03-30 DIAGNOSIS — J3089 Other allergic rhinitis: Secondary | ICD-10-CM | POA: Diagnosis not present

## 2021-03-30 NOTE — Progress Notes (Signed)
EXP 03/30/22

## 2021-04-03 ENCOUNTER — Ambulatory Visit (INDEPENDENT_AMBULATORY_CARE_PROVIDER_SITE_OTHER): Payer: BC Managed Care – PPO

## 2021-04-03 ENCOUNTER — Other Ambulatory Visit: Payer: Self-pay

## 2021-04-03 DIAGNOSIS — J309 Allergic rhinitis, unspecified: Secondary | ICD-10-CM | POA: Diagnosis not present

## 2021-04-06 ENCOUNTER — Ambulatory Visit
Admission: RE | Admit: 2021-04-06 | Discharge: 2021-04-06 | Disposition: A | Discharge status: Home / Self Care | Payer: BC Managed Care – PPO | Source: Ambulatory Visit | Attending: Obstetrics and Gynecology | Admitting: Obstetrics and Gynecology

## 2021-04-06 ENCOUNTER — Other Ambulatory Visit: Payer: Self-pay

## 2021-04-06 DIAGNOSIS — Z1231 Encounter for screening mammogram for malignant neoplasm of breast: Secondary | ICD-10-CM

## 2021-04-24 ENCOUNTER — Other Ambulatory Visit: Payer: Self-pay

## 2021-04-24 ENCOUNTER — Ambulatory Visit (INDEPENDENT_AMBULATORY_CARE_PROVIDER_SITE_OTHER): Payer: BC Managed Care – PPO

## 2021-04-24 DIAGNOSIS — J309 Allergic rhinitis, unspecified: Secondary | ICD-10-CM

## 2021-04-28 ENCOUNTER — Other Ambulatory Visit: Payer: Self-pay | Admitting: Family Medicine

## 2021-05-08 ENCOUNTER — Ambulatory Visit (INDEPENDENT_AMBULATORY_CARE_PROVIDER_SITE_OTHER): Payer: BC Managed Care – PPO

## 2021-05-08 ENCOUNTER — Other Ambulatory Visit: Payer: Self-pay

## 2021-05-08 DIAGNOSIS — J309 Allergic rhinitis, unspecified: Secondary | ICD-10-CM | POA: Diagnosis not present

## 2021-05-22 ENCOUNTER — Ambulatory Visit (INDEPENDENT_AMBULATORY_CARE_PROVIDER_SITE_OTHER): Payer: BC Managed Care – PPO

## 2021-05-22 ENCOUNTER — Other Ambulatory Visit: Payer: Self-pay

## 2021-05-22 DIAGNOSIS — J309 Allergic rhinitis, unspecified: Secondary | ICD-10-CM | POA: Diagnosis not present

## 2021-06-05 ENCOUNTER — Other Ambulatory Visit: Payer: Self-pay

## 2021-06-05 ENCOUNTER — Ambulatory Visit (INDEPENDENT_AMBULATORY_CARE_PROVIDER_SITE_OTHER): Payer: BC Managed Care – PPO

## 2021-06-05 DIAGNOSIS — J309 Allergic rhinitis, unspecified: Secondary | ICD-10-CM

## 2021-06-12 ENCOUNTER — Ambulatory Visit (INDEPENDENT_AMBULATORY_CARE_PROVIDER_SITE_OTHER): Payer: BC Managed Care – PPO

## 2021-06-12 ENCOUNTER — Other Ambulatory Visit: Payer: Self-pay

## 2021-06-12 DIAGNOSIS — J309 Allergic rhinitis, unspecified: Secondary | ICD-10-CM | POA: Diagnosis not present

## 2021-06-19 ENCOUNTER — Other Ambulatory Visit: Payer: Self-pay

## 2021-06-19 ENCOUNTER — Ambulatory Visit (INDEPENDENT_AMBULATORY_CARE_PROVIDER_SITE_OTHER): Payer: BC Managed Care – PPO

## 2021-06-19 DIAGNOSIS — J309 Allergic rhinitis, unspecified: Secondary | ICD-10-CM

## 2021-06-26 ENCOUNTER — Ambulatory Visit (INDEPENDENT_AMBULATORY_CARE_PROVIDER_SITE_OTHER): Payer: BC Managed Care – PPO

## 2021-06-26 ENCOUNTER — Other Ambulatory Visit: Payer: Self-pay

## 2021-06-26 DIAGNOSIS — J309 Allergic rhinitis, unspecified: Secondary | ICD-10-CM | POA: Diagnosis not present

## 2021-06-27 ENCOUNTER — Encounter: Payer: BC Managed Care – PPO | Admitting: Family Medicine

## 2021-07-03 ENCOUNTER — Other Ambulatory Visit: Payer: Self-pay

## 2021-07-03 ENCOUNTER — Ambulatory Visit (INDEPENDENT_AMBULATORY_CARE_PROVIDER_SITE_OTHER): Payer: BC Managed Care – PPO

## 2021-07-03 DIAGNOSIS — J309 Allergic rhinitis, unspecified: Secondary | ICD-10-CM | POA: Diagnosis not present

## 2021-07-17 ENCOUNTER — Other Ambulatory Visit: Payer: Self-pay

## 2021-07-17 ENCOUNTER — Ambulatory Visit (INDEPENDENT_AMBULATORY_CARE_PROVIDER_SITE_OTHER): Payer: BC Managed Care – PPO | Admitting: *Deleted

## 2021-07-17 ENCOUNTER — Ambulatory Visit: Payer: BC Managed Care – PPO | Admitting: Allergy

## 2021-07-17 DIAGNOSIS — J309 Allergic rhinitis, unspecified: Secondary | ICD-10-CM

## 2021-07-31 ENCOUNTER — Ambulatory Visit (INDEPENDENT_AMBULATORY_CARE_PROVIDER_SITE_OTHER): Payer: BC Managed Care – PPO

## 2021-07-31 ENCOUNTER — Other Ambulatory Visit: Payer: Self-pay

## 2021-07-31 DIAGNOSIS — J309 Allergic rhinitis, unspecified: Secondary | ICD-10-CM | POA: Diagnosis not present

## 2021-08-14 ENCOUNTER — Ambulatory Visit (INDEPENDENT_AMBULATORY_CARE_PROVIDER_SITE_OTHER): Payer: BC Managed Care – PPO

## 2021-08-14 ENCOUNTER — Other Ambulatory Visit: Payer: Self-pay

## 2021-08-14 DIAGNOSIS — J309 Allergic rhinitis, unspecified: Secondary | ICD-10-CM | POA: Diagnosis not present

## 2021-08-20 DIAGNOSIS — J3089 Other allergic rhinitis: Secondary | ICD-10-CM | POA: Diagnosis not present

## 2021-08-20 NOTE — Progress Notes (Signed)
VIALS EXP 08-20-22

## 2021-08-28 ENCOUNTER — Other Ambulatory Visit: Payer: Self-pay

## 2021-08-28 ENCOUNTER — Ambulatory Visit (INDEPENDENT_AMBULATORY_CARE_PROVIDER_SITE_OTHER): Payer: BC Managed Care – PPO

## 2021-08-28 DIAGNOSIS — J309 Allergic rhinitis, unspecified: Secondary | ICD-10-CM | POA: Diagnosis not present

## 2021-09-11 ENCOUNTER — Ambulatory Visit (INDEPENDENT_AMBULATORY_CARE_PROVIDER_SITE_OTHER): Payer: BC Managed Care – PPO | Admitting: *Deleted

## 2021-09-11 ENCOUNTER — Other Ambulatory Visit: Payer: Self-pay

## 2021-09-11 DIAGNOSIS — J309 Allergic rhinitis, unspecified: Secondary | ICD-10-CM

## 2021-09-13 ENCOUNTER — Ambulatory Visit: Payer: BC Managed Care – PPO | Admitting: Allergy

## 2021-09-17 ENCOUNTER — Ambulatory Visit: Payer: BC Managed Care – PPO | Admitting: Family Medicine

## 2021-09-25 ENCOUNTER — Ambulatory Visit (INDEPENDENT_AMBULATORY_CARE_PROVIDER_SITE_OTHER): Payer: BC Managed Care – PPO

## 2021-09-25 DIAGNOSIS — J309 Allergic rhinitis, unspecified: Secondary | ICD-10-CM | POA: Diagnosis not present

## 2021-10-11 ENCOUNTER — Ambulatory Visit (INDEPENDENT_AMBULATORY_CARE_PROVIDER_SITE_OTHER): Payer: BC Managed Care – PPO

## 2021-10-11 DIAGNOSIS — J309 Allergic rhinitis, unspecified: Secondary | ICD-10-CM

## 2021-10-16 ENCOUNTER — Ambulatory Visit (INDEPENDENT_AMBULATORY_CARE_PROVIDER_SITE_OTHER): Payer: BC Managed Care – PPO

## 2021-10-16 DIAGNOSIS — J309 Allergic rhinitis, unspecified: Secondary | ICD-10-CM | POA: Diagnosis not present

## 2021-10-18 ENCOUNTER — Encounter: Payer: BC Managed Care – PPO | Admitting: Family Medicine

## 2021-10-23 ENCOUNTER — Ambulatory Visit (INDEPENDENT_AMBULATORY_CARE_PROVIDER_SITE_OTHER): Payer: BC Managed Care – PPO

## 2021-10-23 DIAGNOSIS — J309 Allergic rhinitis, unspecified: Secondary | ICD-10-CM | POA: Diagnosis not present

## 2021-11-01 ENCOUNTER — Ambulatory Visit (INDEPENDENT_AMBULATORY_CARE_PROVIDER_SITE_OTHER): Payer: BC Managed Care – PPO | Admitting: *Deleted

## 2021-11-01 DIAGNOSIS — J309 Allergic rhinitis, unspecified: Secondary | ICD-10-CM

## 2021-11-06 ENCOUNTER — Ambulatory Visit (INDEPENDENT_AMBULATORY_CARE_PROVIDER_SITE_OTHER): Payer: BC Managed Care – PPO | Admitting: *Deleted

## 2021-11-06 DIAGNOSIS — J309 Allergic rhinitis, unspecified: Secondary | ICD-10-CM | POA: Diagnosis not present

## 2021-11-20 ENCOUNTER — Ambulatory Visit (INDEPENDENT_AMBULATORY_CARE_PROVIDER_SITE_OTHER): Payer: BC Managed Care – PPO

## 2021-11-20 DIAGNOSIS — J309 Allergic rhinitis, unspecified: Secondary | ICD-10-CM

## 2021-11-28 ENCOUNTER — Encounter: Payer: Self-pay | Admitting: Family Medicine

## 2021-11-28 ENCOUNTER — Ambulatory Visit (INDEPENDENT_AMBULATORY_CARE_PROVIDER_SITE_OTHER): Payer: BC Managed Care – PPO | Admitting: Family Medicine

## 2021-11-28 VITALS — BP 112/79 | HR 102 | Temp 98.0°F | Ht 65.5 in | Wt 135.0 lb

## 2021-11-28 DIAGNOSIS — Z0000 Encounter for general adult medical examination without abnormal findings: Secondary | ICD-10-CM | POA: Diagnosis not present

## 2021-11-28 DIAGNOSIS — J385 Laryngeal spasm: Secondary | ICD-10-CM | POA: Diagnosis not present

## 2021-11-28 DIAGNOSIS — J383 Other diseases of vocal cords: Secondary | ICD-10-CM

## 2021-11-28 DIAGNOSIS — E038 Other specified hypothyroidism: Secondary | ICD-10-CM | POA: Diagnosis not present

## 2021-11-28 DIAGNOSIS — B351 Tinea unguium: Secondary | ICD-10-CM

## 2021-11-28 DIAGNOSIS — R7989 Other specified abnormal findings of blood chemistry: Secondary | ICD-10-CM | POA: Diagnosis not present

## 2021-11-28 DIAGNOSIS — Z131 Encounter for screening for diabetes mellitus: Secondary | ICD-10-CM

## 2021-11-28 DIAGNOSIS — G43809 Other migraine, not intractable, without status migrainosus: Secondary | ICD-10-CM | POA: Diagnosis not present

## 2021-11-28 DIAGNOSIS — E063 Autoimmune thyroiditis: Secondary | ICD-10-CM

## 2021-11-28 DIAGNOSIS — H3553 Other dystrophies primarily involving the sensory retina: Secondary | ICD-10-CM

## 2021-11-28 HISTORY — DX: Other diseases of vocal cords: J38.3

## 2021-11-28 HISTORY — DX: Laryngeal spasm: J38.5

## 2021-11-28 LAB — CBC
HCT: 41.7 % (ref 36.0–46.0)
Hemoglobin: 13.9 g/dL (ref 12.0–15.0)
MCHC: 33.3 g/dL (ref 30.0–36.0)
MCV: 92.7 fl (ref 78.0–100.0)
Platelets: 231 10*3/uL (ref 150.0–400.0)
RBC: 4.5 Mil/uL (ref 3.87–5.11)
RDW: 13.1 % (ref 11.5–15.5)
WBC: 5.7 10*3/uL (ref 4.0–10.5)

## 2021-11-28 LAB — COMPREHENSIVE METABOLIC PANEL
ALT: 12 U/L (ref 0–35)
AST: 14 U/L (ref 0–37)
Albumin: 4.4 g/dL (ref 3.5–5.2)
Alkaline Phosphatase: 43 U/L (ref 39–117)
BUN: 9 mg/dL (ref 6–23)
CO2: 26 mEq/L (ref 19–32)
Calcium: 9.4 mg/dL (ref 8.4–10.5)
Chloride: 103 mEq/L (ref 96–112)
Creatinine, Ser: 0.66 mg/dL (ref 0.40–1.20)
GFR: 100.39 mL/min (ref >60.00)
Glucose, Bld: 78 mg/dL (ref 70–99)
Potassium: 4.3 mEq/L (ref 3.5–5.1)
Sodium: 139 mEq/L (ref 135–145)
Total Bilirubin: 0.5 mg/dL (ref 0.2–1.2)
Total Protein: 7 g/dL (ref 6.0–8.3)

## 2021-11-28 LAB — LIPID PANEL
Cholesterol: 249 mg/dL — ABNORMAL HIGH (ref 0–200)
HDL: 71.9 mg/dL (ref >39.00)
LDL Cholesterol: 162 mg/dL — ABNORMAL HIGH (ref 0–99)
NonHDL: 177.47
Total CHOL/HDL Ratio: 3
Triglycerides: 79 mg/dL (ref 0.0–149.0)
VLDL: 15.8 mg/dL (ref 0.0–40.0)

## 2021-11-28 LAB — CORTISOL: Cortisol, Plasma: 10.4 ug/dL

## 2021-11-28 LAB — HEMOGLOBIN A1C: Hgb A1c MFr Bld: 5.6 % (ref 4.6–6.5)

## 2021-11-28 IMAGING — US US THYROID
1 series · 14 of 25 positions shown · non-contrast
Comparison: 07/03/2012

CLINICAL DATA: 48-year-old female with a history of autoimmune
thyroid disease

EXAM:
THYROID ULTRASOUND
TECHNIQUE: Ultrasound examination of the thyroid gland and adjacent soft
tissues was performed.

[Series 1: us thyroid · 0.04mm/px · 14 of 34 slices shown]
[im 1/34]
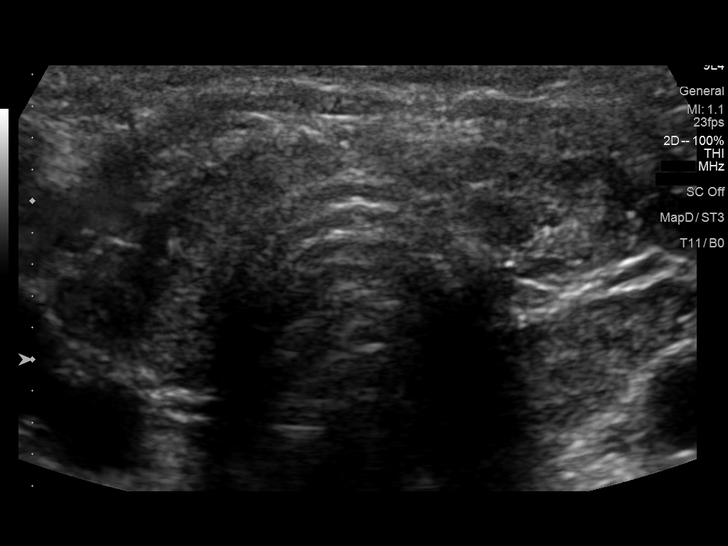
[im 3/34]
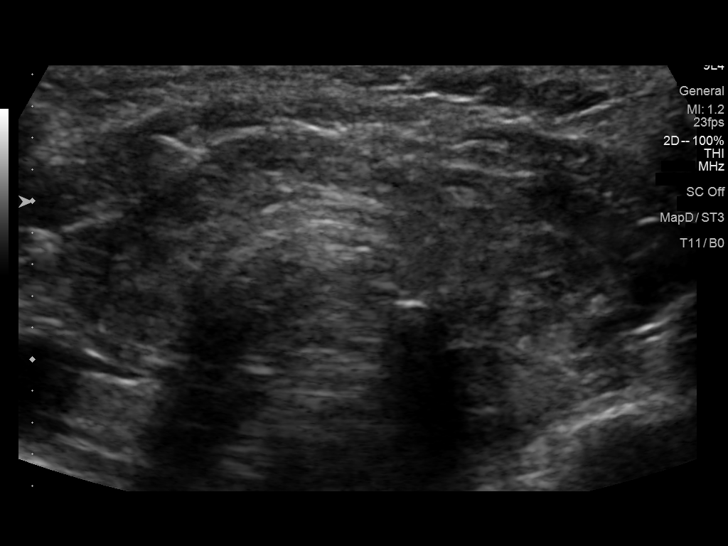
[im 6/34]
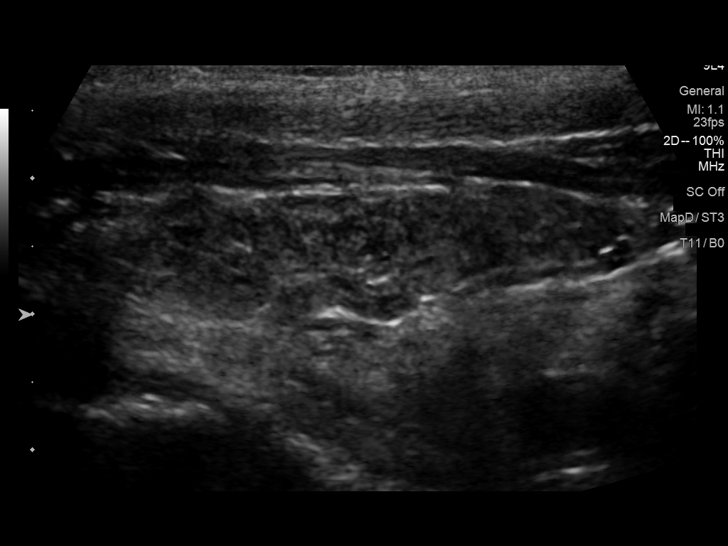
[im 9/34]
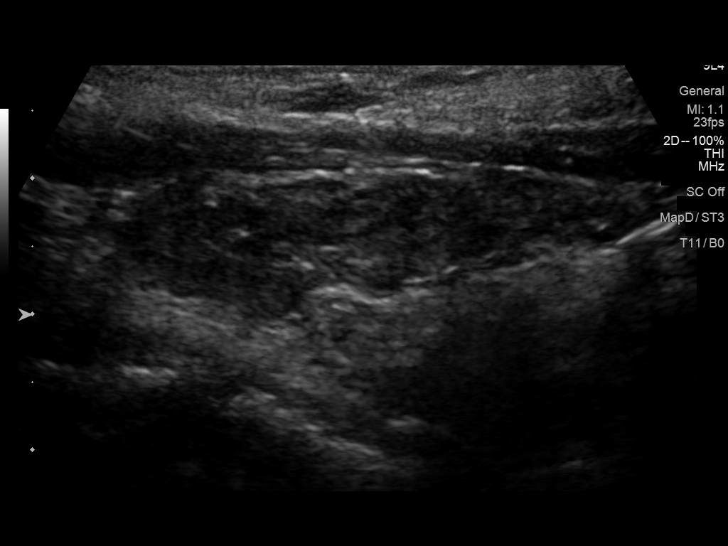
[im 12/34]
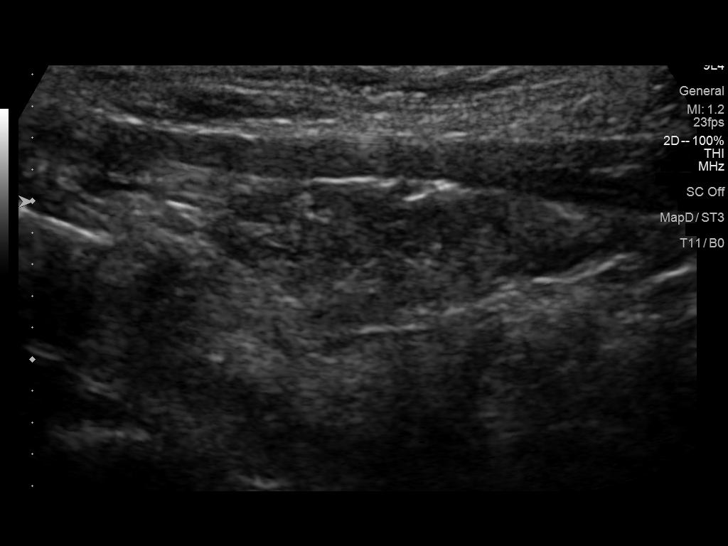
[im 13/34]
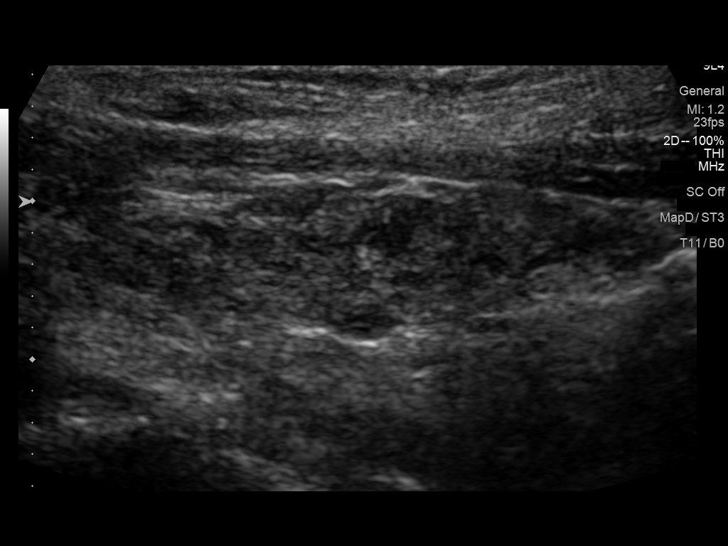
[im 16/34]
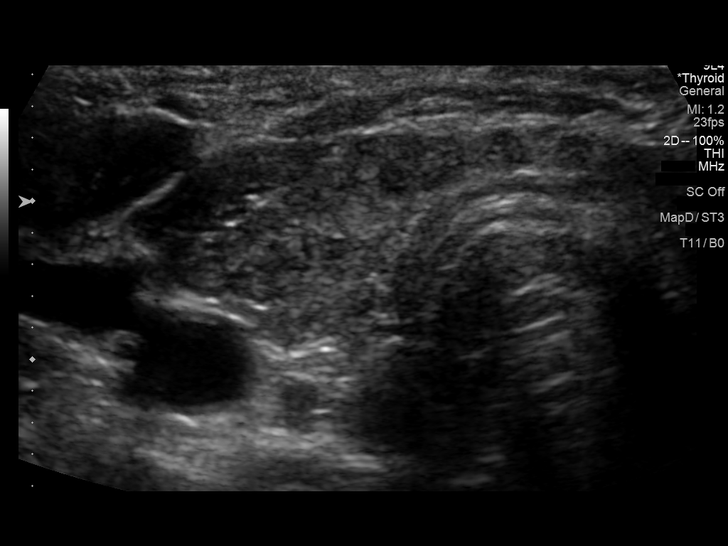
[im 18/34]
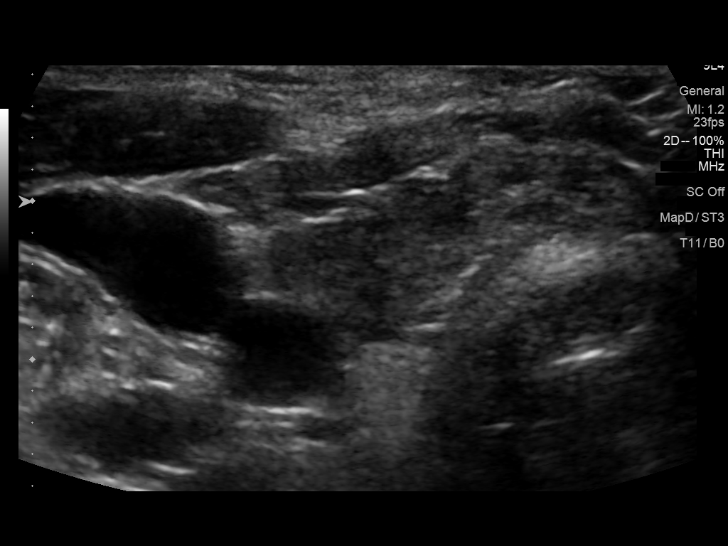
[im 21/34]
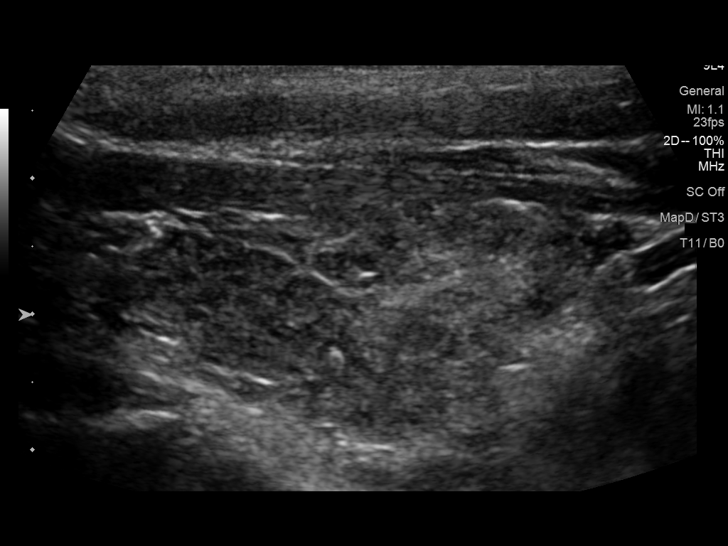
[im 23/34]
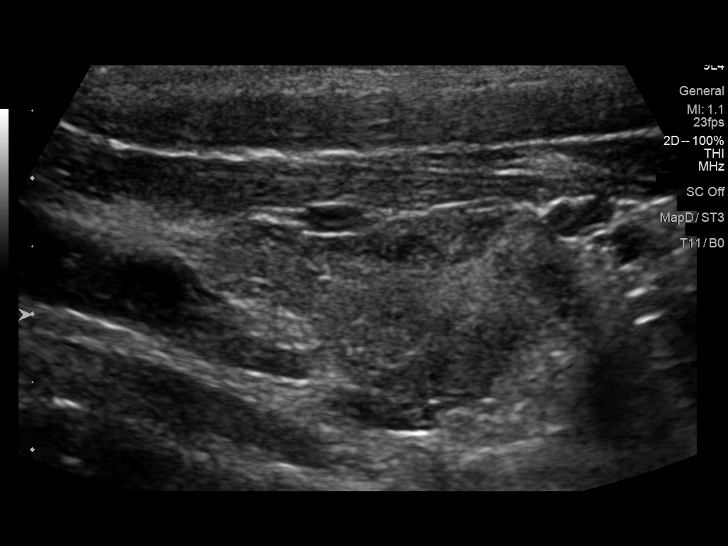
[im 25/34]
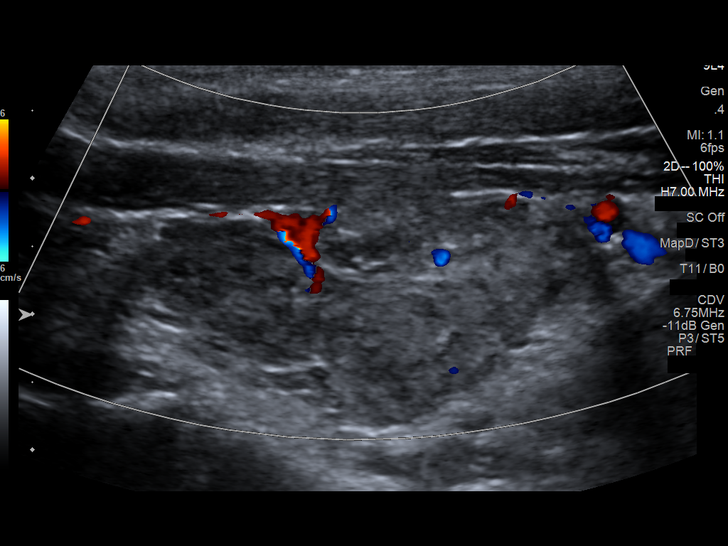
[im 28/34]
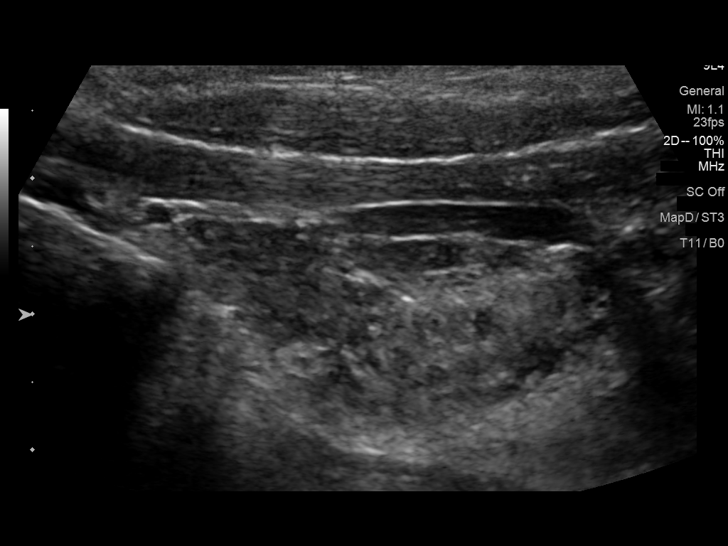
[im 31/34]
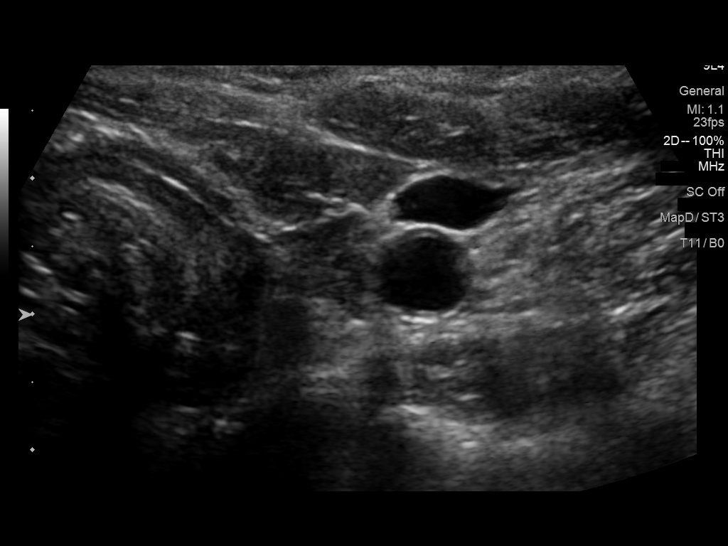
[im 34/34]
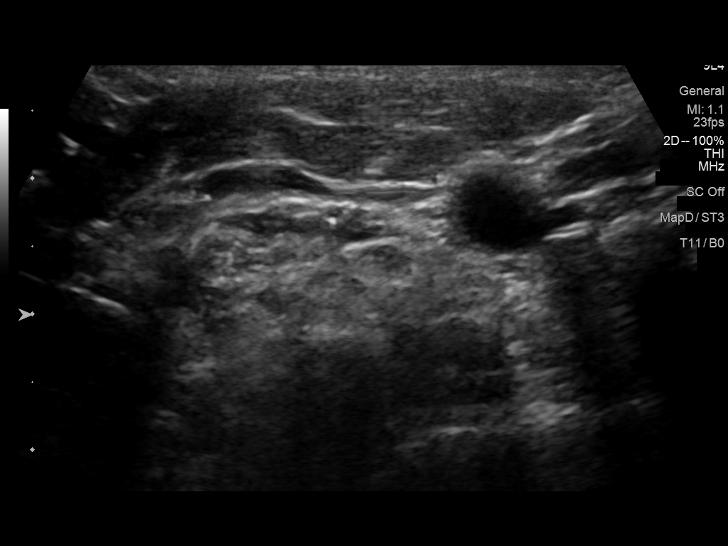

[14 of 25 positions shown; findings below may reference images not displayed]

FINDINGS: Parenchymal Echotexture: Markedly heterogenous

Isthmus: 0.4 cm

Right lobe: 3.9 cm x 0.5 cm x 1.5 cm

Left lobe: 4.3 cm x 1.8 cm x 1.3 cm

_________________________________________________________

Estimated total number of nodules >/= 1 cm: 0

Number of spongiform nodules >/=  2 cm not described below (TR1): 0

Number of mixed cystic and solid nodules >/= 1.5 cm not described
below (TR2): 0

_________________________________________________________

No discrete nodules are seen within the thyroid gland.

No adenopathy
IMPRESSION: Heterogeneous thyroid compatible with medical thyroid disease.

## 2021-11-28 MED ORDER — ZOLMITRIPTAN 5 MG PO TABS
5.0000 mg | ORAL_TABLET | ORAL | 5 refills | Status: DC | PRN
Start: 2021-11-28 — End: 2023-08-20

## 2021-11-28 MED ORDER — RIZATRIPTAN BENZOATE 5 MG PO TABS: 5.0000 mg | ORAL_TABLET | ORAL | 11 refills | Status: DC | PRN

## 2021-11-28 MED ORDER — HYDROXYZINE PAMOATE 25 MG PO CAPS: 25.0000 mg | ORAL_CAPSULE | Freq: Every day | ORAL | 1 refills | Status: DC

## 2021-11-28 NOTE — Patient Instructions (Addendum)

## 2021-11-28 NOTE — Progress Notes (Signed)
Patient ID: [name] [name], female  DOB: 1971-01-12, 51 y.o.   MRN: [id number] Patient Care Team    Relationship Specialty Notifications Start End  [name], DO PCP - General Family Medicine  08/23/19   Fay Records, MD Consulting Physician Cardiology  08/23/19   Arta Silence, MD Consulting Physician Gastroenterology  08/23/19   Otelia Sergeant, OD Referring Physician   08/23/19   Jenelle Mages, MD Referring Physician Endocrinology  08/23/19   Dzhaber, [place] Physician Ophthalmology  11/22/21   Paula Compton, MD Consulting Physician Obstetrics and Gynecology  11/28/21   Chyrel Masson, DO  Otolaryngology  11/28/21   Garnet Sierras, DO Consulting Physician Allergy  11/28/21     Chief Complaint  Patient presents with   Annual Exam    Pt is fasting    Subjective:  MAHDIYA [name] is a 51 y.o.  Female  present for CPE/cmc All past medical history, surgical history, allergies, family history, immunizations, medications and social history were updated in the electronic medical record today. All recent labs, ED visits and hospitalizations within the last year were reviewed.  Health maintenance:  Colonoscopy: completed 02/23/2020- 10 yrs, by Dr. Paulita Fujita Mammogram: completed: 03/2021, birads Dr. Paula Compton Cervical cancer screening: last pap: 2021 [facility] Plan- UTD Immunizations: tdap 04/2018 UTD, Influenza UTD 2022 (encouraged yearly), COVID series and booster completed, Pt reports shingrix series at [facility]. Infectious disease screening: HIV and Hep C declined DEXA: routine at 60-65 Assistive device: none Oxygen WUX:LKGM Patient has a Dental home. Hospitalizations/ED visits: reviewed  migraine without status migrainosus, not intractable Patient reports she has a chronic migraine history.  Over the years she typically averages about 3 migraines a month.  She has used for Zomig for her migraines routinely.  She would like refills on this today.  She  would also like to try Maxalt to see if it works any better.  She is requesting refills on the Vistaril to help when she has migraines.   Hypothyroidism due to Hashimoto's thyroiditis Patient is followed by endocrinology for her condition.  Patient complains of discoloration of bilateral second toenails with deformity and flakiness.  She states she has had this ever since her husband had a fungal infection of his toenail.  She has tried over-the-counter products without any resolution or improvement.  She would like to try a prescription medication today.     11/28/2021    9:56 AM 08/20/2019   10:04 AM  Depression screen PHQ 2/9  Decreased Interest 1 0  Down, Depressed, Hopeless 1 0  PHQ - 2 Score 2 0       No data to display           Immunization History  Administered Date(s) Administered   Hepatitis A 04/03/2018   Hepatitis A, Adult 04/03/2018   Hepatitis B 04/03/2018, 06/04/2018   Hepatitis B, adult 04/03/2018, 06/04/2018   Influenza Inj Mdck Quad Pf 02/15/2018   Influenza Split 02/14/2017   Influenza,inj,Quad PF,6+ Mos 02/23/2020, 02/22/2021   Influenza-Unspecified 02/23/2019   PFIZER Comirnaty(Gray Top)Covid-19 Tri-Sucrose Vaccine 01/04/2021   Rho (D) Immune Globulin 08/14/2011   Td 01/22/2009   Tdap 04/03/2018   Typhoid Live 04/06/2018   Zoster Recombinat (Shingrix) 02/15/2019     Past Medical History:  Diagnosis Date   Angio-edema    Chicken pox    Closed displaced fracture of right great toe 09/01/2018   Constipation    Fibromyalgia    Hashimoto's disease  Hyperlipidemia    Hypothyroidism    IBS (irritable bowel syndrome)    Menopausal syndrome    Migraines    PONV (postoperative nausea and vomiting)    Renal cyst 05/06/2017   bozniak 2 cyst   Seasonal allergies    Urticaria    Allergies  Allergen Reactions   Thimerosal Hives   Pneumococcal Vaccine Hives, Itching, Nausea Only and Rash   Past Surgical History:  Procedure Laterality Date    COLONOSCOPY  2016   DILATION AND CURETTAGE OF UTERUS  2006   missed ab   DILATION AND EVACUATION  08/14/2011   Procedure: DILATATION AND EVACUATION;  Surgeon: Logan Bores, MD;  Location: [facility];  Service: Gynecology;  Laterality: N/A;   WISDOM TOOTH EXTRACTION     Family History  Problem Relation Age of Onset   Hypertension Mother    Arthritis Mother    Depression Mother    Diabetes Mother    Hyperlipidemia Mother    Mental illness Mother    Hypertension Father    Early death Father 58   Anuerysm Father 49       brain   Depression Brother    Mental illness Brother    Asthma Maternal Grandmother    Depression Maternal Grandmother    Heart disease Maternal Grandmother    Depression Paternal Grandfather    Asthma Daughter    Allergic rhinitis Neg Hx    Eczema Neg Hx    Urticaria Neg Hx    Social History   Social History Narrative   Marital status/children/pets: Married.  5 children.   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   Safety:      -smoke alarm in the home:Yes     - wears seatbelt: Yes     - Feels safe in their relationships: Yes       Allergies as of 11/28/2021       Reactions   Thimerosal Hives   Pneumococcal Vaccine Hives, Itching, Nausea Only, Rash        Medication List        Accurate as of November 28, 2021 11:59 PM. If you have any questions, ask your nurse or doctor.          aspirin EC 81 MG tablet Take 81 mg by mouth as needed.   hydrOXYzine 25 MG capsule Commonly known as: VISTARIL Take 1 capsule (25 mg total) by mouth at bedtime. Started by: [name], DO   PRESCRIPTION MEDICATION Take 1 capsule by mouth daily. Levothyroxine 42mg, Liothyronine 739m Slow Release Oral Capsule   progesterone 100 MG capsule Commonly known as: PROMETRIUM Take 100 mg by mouth daily.   rizatriptan 5 MG tablet Commonly known as: MAXALT Take 1-2 tablets (5-10 mg total) by mouth as needed for migraine. May repeat in 2  hours if needed Started by: ReHoward PouchDO   zolmitriptan 5 MG tablet Commonly known as: ZOMIG Take 1 tablet (5 mg total) by mouth as needed.        All past medical history, surgical history, allergies, family history, immunizations andmedications were updated in the EMR today and reviewed under the history and medication portions of their EMR.     Recent Results (from the past 2160 hour(s))  CBC     Status: None   Collection Time: 11/28/21 10:34 AM  Result Value Ref Range   WBC 5.7 4.0 - 10.5 K/uL   RBC 4.50 3.87 - 5.11 Mil/uL   Platelets  231.0 150.0 - 400.0 K/uL   Hemoglobin 13.9 12.0 - 15.0 g/dL   HCT 41.7 36.0 - 46.0 %   MCV 92.7 78.0 - 100.0 fl   MCHC 33.3 30.0 - 36.0 g/dL   RDW 13.1 11.5 - 15.5 %  Comprehensive metabolic panel     Status: None   Collection Time: 11/28/21 10:34 AM  Result Value Ref Range   Sodium 139 135 - 145 mEq/L   Potassium 4.3 3.5 - 5.1 mEq/L   Chloride 103 96 - 112 mEq/L   CO2 26 19 - 32 mEq/L   Glucose, Bld 78 70 - 99 mg/dL   BUN 9 6 - 23 mg/dL   Creatinine, Ser 0.66 0.40 - 1.20 mg/dL   Total Bilirubin 0.5 0.2 - 1.2 mg/dL   Alkaline Phosphatase 43 39 - 117 U/L   AST 14 0 - 37 U/L   ALT 12 0 - 35 U/L   Total Protein 7.0 6.0 - 8.3 g/dL   Albumin 4.4 3.5 - 5.2 g/dL   GFR 100.39 >60.00 mL/min    Comment: Calculated using the CKD-EPI Creatinine Equation (2021)   Calcium 9.4 8.4 - 10.5 mg/dL  Hemoglobin A1c     Status: None   Collection Time: 11/28/21 10:34 AM  Result Value Ref Range   Hgb A1c MFr Bld 5.6 4.6 - 6.5 %    Comment: Glycemic Control Guidelines for People with Diabetes:Non Diabetic:  <6%Goal of Therapy: <7%Additional Action Suggested:  >8%   Lipid panel     Status: Abnormal   Collection Time: 11/28/21 10:34 AM  Result Value Ref Range   Cholesterol 249 (H) 0 - 200 mg/dL    Comment: ATP III Classification       Desirable:  < 200 mg/dL               Borderline High:  200 - 239 mg/dL          High:  > = 240 mg/dL   Triglycerides  79.0 0.0 - 149.0 mg/dL    Comment: Normal:  <150 mg/dLBorderline High:  150 - 199 mg/dL   HDL 71.90 >39.00 mg/dL   VLDL 15.8 0.0 - 40.0 mg/dL   LDL Cholesterol 162 (H) 0 - 99 mg/dL   Total CHOL/HDL Ratio 3     Comment:                Men          Women1/2 Average Risk     3.4          3.3Average Risk          5.0          4.42X Average Risk          9.6          7.13X Average Risk          15.0          11.0                       NonHDL 177.47     Comment: NOTE:  Non-HDL goal should be 30 mg/dL higher than patient's LDL goal (i.e. LDL goal of < 70 mg/dL, would have non-HDL goal of < 100 mg/dL)  Cortisol     Status: None   Collection Time: 11/28/21 10:34 AM  Result Value Ref Range   Cortisol, Plasma 10.4 ug/dL    Comment: AM:  4.3 - 22.4 ug/dLPM:  3.1 -  16.7 ug/dL    MM 3D SCREEN BREAST BILATERAL  Result Date: 04/12/2021 IMPRESSION: No mammographic evidence of malignancy. A result letter of this screening mammogram will be mailed directly to the patient. RECOMMENDATION: Screening mammogram in one year. (Code:SM-B-01Y) BI-RADS CATEGORY  1: Negative.    ROS 14 pt review of systems performed and negative (unless mentioned in an HPI)  Objective: BP 112/79   Pulse (!) 102   Temp 98 F (36.7 C) (Oral)   Ht 5' 5.5" (1.664 m)   Wt 135 lb (61.2 kg)   LMP 11/12/2021   SpO2 98%   BMI 22.12 kg/m  Physical Exam Vitals and nursing note reviewed.  Constitutional:      General: She is not in acute distress.    Appearance: Normal appearance. She is not ill-appearing or toxic-appearing.  HENT:     Head: Normocephalic and atraumatic.     Right Ear: Tympanic membrane, ear canal and external ear normal. There is no impacted cerumen.     Left Ear: Tympanic membrane, ear canal and external ear normal. There is no impacted cerumen.     Nose: No congestion or rhinorrhea.     Mouth/Throat:     Mouth: Mucous membranes are moist.     Pharynx: Oropharynx is clear. No oropharyngeal exudate or  posterior oropharyngeal erythema.  Eyes:     General: No scleral icterus.       Right eye: No discharge.        Left eye: No discharge.     Extraocular Movements: Extraocular movements intact.     Conjunctiva/sclera: Conjunctivae normal.     Pupils: Pupils are equal, round, and reactive to light.  Cardiovascular:     Rate and Rhythm: Normal rate and regular rhythm.     Pulses: Normal pulses.     Heart sounds: Normal heart sounds. No murmur heard.    No friction rub. No gallop.  Pulmonary:     Effort: Pulmonary effort is normal. No respiratory distress.     Breath sounds: Normal breath sounds. No stridor. No wheezing, rhonchi or rales.  Chest:     Chest wall: No tenderness.  Abdominal:     General: Abdomen is flat. Bowel sounds are normal. There is no distension.     Palpations: Abdomen is soft. There is no mass.     Tenderness: There is no abdominal tenderness. There is no right CVA tenderness, left CVA tenderness, guarding or rebound.     Hernia: No hernia is present.  Musculoskeletal:        General: No swelling, tenderness or deformity. Normal range of motion.     Cervical back: Normal range of motion and neck supple. No rigidity or tenderness.     Right lower leg: No edema.     Left lower leg: No edema.     Comments: Bilateral second toenails with mild elevated deformity and scaliness present.  No rash or redness of toes/skin.  Lymphadenopathy:     Cervical: No cervical adenopathy.  Skin:    General: Skin is warm and dry.     Coloration: Skin is not jaundiced or pale.     Findings: No bruising, erythema, lesion or rash.  Neurological:     General: No focal deficit present.     Mental Status: She is alert and oriented to person, place, and time. Mental status is at baseline.     Cranial Nerves: No cranial nerve deficit.     Sensory: No sensory deficit.  Motor: No weakness.     Coordination: Coordination normal.     Gait: Gait normal.     Deep Tendon Reflexes: Reflexes  normal.  Psychiatric:        Mood and Affect: Mood normal.        Behavior: Behavior normal.        Thought Content: Thought content normal.        Judgment: Judgment normal.       No results found.  Assessment/plan: NASIYAH LAVERDIERE is a 51 y.o. female present for CPE /cmc migraine without status migrainosus, not intractable/vegetarian/sleep disturbance/family history of brain aneurysm in her father Encourage patient to start B12 over-the-counter supplementation and magnesium. Continue  Zomig as needed. She also wanted to try Maxalt.  This was called in as well.  Patient is aware insurance may elect not to pay for both Zomig and Maxalt. Continue Vistaril nightly as needed Family history of brain aneurysm in her father, low threshold of imaging if not done prior. - CBC - Comprehensive metabolic panel   Hypothyroidism due to Hashimoto's thyroiditis Follows with endocrinology.  We will continue to follow with endocrinology.  Currently uses a compounded medication. Recent labs normal per pt.  - CBC - Lipid panel Diabetes mellitus screening - Hemoglobin A1c  Laryngeal spasm/Vocal fold atrophy She is following with multiple specialists in otolaryngology for condition.  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  Elevated morning serum cortisol level She is established with the thyroid center in [facility] with Dr. Rodney Booze for this condition.  Lab was collected secondary to patient request for an elevated cortisol at the above provider's office.  No records were received prior. - Cortisol  Onychomycosis Bilateral second toes with nail deformity and scaliness.  Failed conservative OTC treatments and desires terbinafine 250 mg daily x12 weeks prescribed. LFTs are normal. Patient will need LFTs repeated in 4 weeks if starting medication  Routine general medical examination at a health care  facility Colonoscopy: completed 02/23/2020- 10 yrs, by Dr. Paulita Fujita Mammogram: completed: 03/2021, birads Dr. Paula Compton Cervical cancer screening: last pap: 2021 [facility] Plan- UTD Immunizations: tdap 04/2018 UTD, Influenza UTD 2022 (encouraged yearly), COVID series and booster completed, Pt reports shingrix series at [facility]. Infectious disease screening: HIV and Hep C declined DEXA: routine at 60-65 Patient was encouraged to exercise greater than 150 minutes a week. Patient was encouraged to choose a diet filled with fresh fruits and vegetables, and lean meats. AVS provided to patient today for education/recommendation on gender specific health and safety maintenance. Return in about 1 year (around 11/30/2022) for cpe (20 min).  Orders Placed This Encounter  Procedures   CBC   Comprehensive metabolic panel   Hemoglobin A1c   Lipid panel   Cortisol   Meds ordered this encounter  Medications   zolmitriptan (ZOMIG) 5 MG tablet    Sig: Take 1 tablet (5 mg total) by mouth as needed.    Dispense:  10 tablet    Refill:  5   rizatriptan (MAXALT) 5 MG tablet    Sig: Take 1-2 tablets (5-10 mg total) by mouth as needed for migraine. May repeat in 2 hours if needed    Dispense:  10 tablet    Refill:  11   hydrOXYzine (VISTARIL) 25 MG capsule    Sig: Take 1 capsule (25 mg total) by mouth at bedtime.    Dispense:  90 capsule    Refill:  1  Referral Orders  No referral(s) requested today     Electronically signed by: [name], [place]

## 2021-11-29 ENCOUNTER — Telehealth: Payer: Self-pay | Admitting: Family Medicine

## 2021-11-29 DIAGNOSIS — B351 Tinea unguium: Secondary | ICD-10-CM

## 2021-11-29 DIAGNOSIS — Z79899 Other long term (current) drug therapy: Secondary | ICD-10-CM

## 2021-11-29 HISTORY — DX: Tinea unguium: B35.1

## 2021-11-29 MED ORDER — TERBINAFINE HCL 250 MG PO TABS: 250.0000 mg | ORAL_TABLET | Freq: Every day | ORAL | 0 refills | Status: DC

## 2021-11-29 NOTE — Progress Notes (Signed)
11 9 21  

## 2021-11-29 NOTE — Telephone Encounter (Signed)
Spoke with pt regarding labs and instructions.   

## 2021-11-29 NOTE — Telephone Encounter (Signed)
Pt contact pharmacy who stated they did not keep vax records from 3 yrs ago. She would like to know what is the best route to be fully vax for shingles?

## 2021-11-29 NOTE — Telephone Encounter (Signed)
Please call patient Liver and kidney function are normal. Blood cell counts and electrolytes are normal Diabetes screening/A1c is normal  Cholesterol panel is above goal for her LDL/bad cholesterol at 162.  This level had been 139 last year.    -I would encourage her to obtain routine exercise, at least 150 minutes a week.  Focus on a low saturated fat/trans fat diet which avoids butters, red meats and fatty meats.   -I would encourage her to follow-up in 6 months after making behavioral modifications mentioned above, and we will recheck her cholesterol levels at that time.  If they are still elevated we may need to consider starting a medication to help her lower her cholesterol and provide her cardiovascular protection. Cortisol level is normal at 10.4.

## 2021-11-30 NOTE — Telephone Encounter (Signed)
LVM for pt to CB regarding vax.

## 2021-11-30 NOTE — Telephone Encounter (Signed)
LVM for pt to CB regarding prior conversation.

## 2021-11-30 NOTE — Telephone Encounter (Signed)
If she is uncertain if she received the second vaccine, she can call her insurance and see if she has had a charge to her insurance for 2 vaccines for the Shingrix. If she knows she get the second vaccine, but we just do not have the record, then she has received full vaccination.

## 2021-12-03 NOTE — Telephone Encounter (Signed)
Pt called insurance and they worked with [facility] to determine pt has received 2 doses of shingles vax.  02/15/2019 & 05/24/2019

## 2021-12-03 NOTE — Telephone Encounter (Signed)
Pt called back. She was advised of below. [facility] doesn't have record of 2nd dose. She is going to call insurance and see if it was billed and will f/u after that.

## 2021-12-03 NOTE — Telephone Encounter (Signed)
noted 

## 2021-12-03 NOTE — Telephone Encounter (Signed)
LVM for pt to CB regarding vax.

## 2021-12-03 NOTE — Telephone Encounter (Signed)
Chart updated

## 2021-12-04 ENCOUNTER — Ambulatory Visit (INDEPENDENT_AMBULATORY_CARE_PROVIDER_SITE_OTHER): Payer: BC Managed Care – PPO

## 2021-12-04 DIAGNOSIS — J309 Allergic rhinitis, unspecified: Secondary | ICD-10-CM | POA: Diagnosis not present

## 2021-12-18 ENCOUNTER — Ambulatory Visit (INDEPENDENT_AMBULATORY_CARE_PROVIDER_SITE_OTHER): Payer: BC Managed Care – PPO

## 2021-12-18 DIAGNOSIS — J309 Allergic rhinitis, unspecified: Secondary | ICD-10-CM

## 2021-12-20 ENCOUNTER — Ambulatory Visit: Payer: BC Managed Care – PPO | Admitting: Allergy

## 2022-01-03 ENCOUNTER — Other Ambulatory Visit: Payer: Self-pay | Admitting: Family Medicine

## 2022-01-03 NOTE — Telephone Encounter (Signed)
Insurance do not cover and asking to send Imitrex as an alt medication

## 2022-01-03 NOTE — Telephone Encounter (Signed)
Please call patient and if she is truly requesting additional refills, we will need to see her to evaluate.  Other options are referral to neurology to discuss.  If she opts for neurology referral please place this for

## 2022-01-03 NOTE — Telephone Encounter (Signed)
Please advise on alt medication.  

## 2022-01-03 NOTE — Telephone Encounter (Signed)
Please clarify with pharmacy and patient: I do not understand the request.  Patient has prescriptions already for Maxalt and Zomig, yet they are asking me to replace Imitrex (which we do not prescribe for her any longer), with one of the prescriptions we already prescribe to her.

## 2022-01-03 NOTE — Telephone Encounter (Signed)
Called pharmacy who stated pt taking rizatriptan and Zomig pt is over her limit of 36 tabs in 75 days.

## 2022-01-04 NOTE — Telephone Encounter (Signed)
Spoke with provider and informed her that pharmacy was informing us of the dosing limitations. No call needed at this time.

## 2022-01-08 ENCOUNTER — Ambulatory Visit (INDEPENDENT_AMBULATORY_CARE_PROVIDER_SITE_OTHER): Payer: BC Managed Care – PPO

## 2022-01-08 DIAGNOSIS — J309 Allergic rhinitis, unspecified: Secondary | ICD-10-CM

## 2022-01-14 DIAGNOSIS — J3089 Other allergic rhinitis: Secondary | ICD-10-CM | POA: Diagnosis not present

## 2022-01-14 NOTE — Progress Notes (Signed)
[facility]S EXP 01-15-23

## 2022-01-22 ENCOUNTER — Ambulatory Visit (INDEPENDENT_AMBULATORY_CARE_PROVIDER_SITE_OTHER): Payer: BC Managed Care – PPO

## 2022-01-22 DIAGNOSIS — J309 Allergic rhinitis, unspecified: Secondary | ICD-10-CM

## 2022-02-12 ENCOUNTER — Ambulatory Visit: Payer: BC Managed Care – PPO | Admitting: Allergy

## 2022-02-13 NOTE — Progress Notes (Unsigned)
Follow Up Note  RE: [name] [name] MRN: [id number] DOB: Sep 27, 1970 Date of Office Visit: 02/14/2022  Referring provider: [name], DO Primary care provider: [name], DO  Chief Complaint: No chief complaint on file.  History of Present Illness: I had the pleasure of seeing [name] [name] for a follow up visit at the [facility] on 02/13/2022. She is a 51 y.o. female, who is being followed for allergic rhinitis on AIT. Her previous allergy office visit was on 04/25/2020 with Dr. Maudie Mercury. Today is a regular follow up visit.  05/16/2020   [name] [name] here to start allergy injections. Patient received 0.05 of both her blue vials with an expiration of 04/26/2021. One with G-RW-W-T and the other with M-DM-C-CR.   Other allergic rhinitis Perennial rhinitis symptoms for many years.  The last few years noting postnasal drip and voice changes with throat clearing.  Patient used to be on allergy injections as a child and as an adult with some benefit.  Evaluated by ENT in the past as well.  Tried over-the-counter antihistamines, Flonase, Dymista and Singulair with no benefit.  Denies heartburn/reflux symptoms. Today's skin testing showed: Positive to grass, ragweed, trees, mold, dust mites, cat and cockroach. Borderline to weeds, horse. Negative dog, rabbit and [place] pigs.  Start environmental control measures as below. Start Ryvent 1 tablet twice a day. Sample given if not covered let us know. Continue Singulair (montelukast) '10mg'$  daily at night. Continue dymista (fluticasone + azelastine nasal spray combination) 1 spray per nostril twice a day. Nasal saline spray (i.e., Simply Saline) or nasal saline lavage (i.e., NeilMed) is recommended as needed and prior to medicated nasal sprays. Had a detailed discussion with patient/family that clinical history is suggestive of allergic rhinitis, and may benefit from allergy immunotherapy (AIT). Discussed in detail  regarding the dosing, schedule, side effects (mild to moderate local allergic reaction and rarely systemic allergic reactions including anaphylaxis), and benefits (significant improvement in nasal symptoms, seasonal flares of asthma) of immunotherapy with the patient. There is significant time commitment involved with allergy shots, which includes weekly immunotherapy injections for first 9-12 months and then biweekly to monthly injections for 3-5 years.  Consent was signed. Start allergy injections.  For mild symptoms you can take over the counter antihistamines such as Benadryl and monitor symptoms closely. If symptoms worsen or if you have severe symptoms including breathing issues, throat closure, significant swelling, whole body hives, severe diarrhea and vomiting, lightheadedness then inject epinephrine and seek immediate medical care afterwards. Action plan given.  Follow up with ENT as scheduled.    Return in about 3 months (around 07/26/2020).    Assessment and Plan: [name] [name] is a 51 y.o. female with: No problem-specific Assessment & Plan notes found for this encounter.  No follow-ups on file.  No orders of the defined types were placed in this encounter.  Lab Orders  No laboratory test(s) ordered today    Diagnostics: Spirometry:  Tracings reviewed. Her effort: {Blank single:19197::"Good reproducible efforts.","It was hard to get consistent efforts and there is a question as to whether this reflects a maximal maneuver.","Poor effort, data can not be interpreted."} FVC: ***L FEV1: ***L, ***% predicted FEV1/FVC ratio: ***% Interpretation: {Blank single:19197::"Spirometry consistent with mild obstructive disease","Spirometry consistent with moderate obstructive disease","Spirometry consistent with severe obstructive disease","Spirometry consistent with possible restrictive disease","Spirometry consistent with mixed obstructive and restrictive disease","Spirometry uninterpretable due  to technique","Spirometry consistent with normal pattern","No overt abnormalities noted given today's efforts"}.  Please see scanned spirometry results for details.  Skin Testing: {Blank single:19197::"Select foods","Environmental allergy panel","Environmental allergy panel and select foods","Food allergy panel","None","Deferred due to recent antihistamines use"}. *** Results discussed with patient/family.   Medication List:  Current Outpatient Medications  Medication Sig Dispense Refill   aspirin EC 81 MG tablet Take 81 mg by mouth as needed.     hydrOXYzine (VISTARIL) 25 MG capsule Take 1 capsule (25 mg total) by mouth at bedtime. 90 capsule 1   PRESCRIPTION MEDICATION Take 1 capsule by mouth daily. Levothyroxine 41mg, Liothyronine 796m Slow Release Oral Capsule     progesterone (PROMETRIUM) 100 MG capsule Take 100 mg by mouth daily.     rizatriptan (MAXALT) 5 MG tablet Take 1-2 tablets (5-10 mg total) by mouth as needed for migraine. May repeat in 2 hours if needed 10 tablet 11   terbinafine (LAMISIL) 250 MG tablet Take 1 tablet (250 mg total) by mouth daily. 84 tablet 0   zolmitriptan (ZOMIG) 5 MG tablet Take 1 tablet (5 mg total) by mouth as needed. 10 tablet 5   No current facility-administered medications for this visit.   Allergies: Allergies  Allergen Reactions   Thimerosal Hives   Pneumococcal Vaccine Hives, Itching, Nausea Only and Rash   I reviewed her past medical history, social history, family history, and environmental history and no significant changes have been reported from her previous visit.  Review of Systems  Constitutional:  Negative for appetite change, chills, fever and unexpected weight change.  HENT:  Positive for postnasal drip, sore throat and voice change. Negative for congestion and rhinorrhea.   Eyes:  Negative for itching.  Respiratory:  Negative for cough, chest tightness, shortness of breath and wheezing.   Cardiovascular:  Negative for chest  pain.  Gastrointestinal:  Negative for abdominal pain.  Genitourinary:  Negative for difficulty urinating.  Skin:  Negative for rash.  Allergic/Immunologic: Positive for environmental allergies.  Neurological:  Negative for headaches.    Objective: There were no vitals taken for this visit. There is no height or weight on file to calculate BMI. Physical Exam Vitals and nursing note reviewed.  Constitutional:      Appearance: Normal appearance. She is well-developed.  HENT:     Head: Normocephalic and atraumatic.     Right Ear: External ear normal. There is impacted cerumen.     Left Ear: External ear normal.     Nose: Nose normal.     Mouth/Throat:     Mouth: Mucous membranes are moist.     Pharynx: Oropharynx is clear.  Eyes:     Conjunctiva/sclera: Conjunctivae normal.  Cardiovascular:     Rate and Rhythm: Normal rate and regular rhythm.     [name] sounds: Normal [name] sounds. No murmur heard.    No friction rub. No gallop.  Pulmonary:     Effort: Pulmonary effort is normal.     Breath sounds: Normal breath sounds. No wheezing, rhonchi or rales.  Abdominal:     Palpations: Abdomen is soft.  Musculoskeletal:     Cervical back: Neck supple.  Skin:    General: Skin is warm.     Findings: No rash.  Neurological:     Mental Status: She is alert and oriented to person, place, and time.  Psychiatric:        Behavior: Behavior normal.    Previous notes and tests were reviewed. The plan was reviewed with the patient/family, and all questions/concerned were addressed.  It was my pleasure to  see [name] [name] today and participate in her care. Please feel free to contact me with any questions or concerns.  Sincerely,  Rexene Alberts, DO Allergy & Immunology  [facility] office: [place] office: [phone]

## 2022-02-14 ENCOUNTER — Encounter: Payer: Self-pay | Admitting: Allergy

## 2022-02-14 ENCOUNTER — Ambulatory Visit: Payer: BC Managed Care – PPO | Admitting: Allergy

## 2022-02-14 VITALS — BP 104/68 | HR 80 | Temp 98.6°F | Resp 18 | Ht 65.5 in | Wt 137.0 lb

## 2022-02-14 DIAGNOSIS — J309 Allergic rhinitis, unspecified: Secondary | ICD-10-CM

## 2022-02-14 DIAGNOSIS — J302 Other seasonal allergic rhinitis: Secondary | ICD-10-CM

## 2022-02-14 NOTE — Assessment & Plan Note (Signed)
Past history - Perennial rhinitis symptoms for many years.  The last few years noting postnasal drip and voice changes with throat clearing.  Patient used to be on allergy injections as a child and as an adult with some benefit.  Evaluated by ENT in the past as well.  Tried over-the-counter antihistamines, Flonase, Dymista and Singulair with no benefit.  Denies heartburn/reflux symptoms. 2021 skin testing showed: Positive to grass, ragweed, trees, mold, dust mites, cat and cockroach. Borderline to weeds, horse. Negative dog, rabbit and [place] pigs.  Interim history - started AIT on 05/16/2020 (G-RW-W-T and M-DM-C-CR) with no issues. Since then had vocal fold lipo augmentation in May 2022 by Dr. Rowe Clack with great benefit.   Continue environmental control measures.  Continue allergy injections - given today.  Will discuss stopping AIT in 1 year at next visit.   Use over the counter antihistamines such as Zyrtec (cetirizine), Claritin (loratadine), Allegra (fexofenadine), or Xyzal (levocetirizine) daily as needed. May switch antihistamines every few months.

## 2022-02-14 NOTE — Patient Instructions (Addendum)
Environmental allergies 2021 skin testing showed: Positive to grass, ragweed, trees, mold, dust mites, cat and cockroach. Borderline to weeds, horse. Negative dog, rabbit and [place] pigs.  Continue environmental control measures. Continue allergy injections - given today. Use over the counter antihistamines such as Zyrtec (cetirizine), Claritin (loratadine), Allegra (fexofenadine), or Xyzal (levocetirizine) daily as needed. May switch antihistamines every few months.  Follow up in 12 months or sooner if needed.

## 2022-03-06 ENCOUNTER — Encounter: Payer: Self-pay | Admitting: Family Medicine

## 2022-03-06 ENCOUNTER — Ambulatory Visit (INDEPENDENT_AMBULATORY_CARE_PROVIDER_SITE_OTHER): Payer: BC Managed Care – PPO

## 2022-03-06 DIAGNOSIS — Z23 Encounter for immunization: Secondary | ICD-10-CM

## 2022-03-19 ENCOUNTER — Ambulatory Visit (INDEPENDENT_AMBULATORY_CARE_PROVIDER_SITE_OTHER): Payer: BC Managed Care – PPO

## 2022-03-19 DIAGNOSIS — J309 Allergic rhinitis, unspecified: Secondary | ICD-10-CM | POA: Diagnosis not present

## 2022-03-28 ENCOUNTER — Ambulatory Visit (INDEPENDENT_AMBULATORY_CARE_PROVIDER_SITE_OTHER): Payer: BC Managed Care – PPO

## 2022-03-28 DIAGNOSIS — J309 Allergic rhinitis, unspecified: Secondary | ICD-10-CM

## 2022-04-02 ENCOUNTER — Ambulatory Visit (INDEPENDENT_AMBULATORY_CARE_PROVIDER_SITE_OTHER): Payer: BC Managed Care – PPO

## 2022-04-02 DIAGNOSIS — J309 Allergic rhinitis, unspecified: Secondary | ICD-10-CM | POA: Diagnosis not present

## 2022-04-09 ENCOUNTER — Ambulatory Visit (INDEPENDENT_AMBULATORY_CARE_PROVIDER_SITE_OTHER): Payer: BC Managed Care – PPO

## 2022-04-09 DIAGNOSIS — J309 Allergic rhinitis, unspecified: Secondary | ICD-10-CM

## 2022-04-11 ENCOUNTER — Other Ambulatory Visit: Payer: Self-pay | Admitting: Obstetrics and Gynecology

## 2022-04-11 DIAGNOSIS — Z139 Encounter for screening, unspecified: Secondary | ICD-10-CM

## 2022-04-18 ENCOUNTER — Ambulatory Visit (INDEPENDENT_AMBULATORY_CARE_PROVIDER_SITE_OTHER): Payer: BC Managed Care – PPO

## 2022-04-18 DIAGNOSIS — J309 Allergic rhinitis, unspecified: Secondary | ICD-10-CM | POA: Diagnosis not present

## 2022-05-06 ENCOUNTER — Ambulatory Visit: Payer: BC Managed Care – PPO | Admitting: Family Medicine

## 2022-05-08 ENCOUNTER — Ambulatory Visit: Payer: BC Managed Care – PPO | Admitting: Family Medicine

## 2022-05-08 ENCOUNTER — Encounter: Payer: Self-pay | Admitting: Family Medicine

## 2022-05-08 NOTE — Telephone Encounter (Signed)
Cardiologist treat cardiac disease.  They do not typically accept patients to screen for cardiac diseases. For a patient without any symptoms, a cardiac work-up  can be completed through primary care. Without any symptoms or problems, a cardiac work-up consists of making sure her cholesterol is well controlled, she does not have hypertension and is eating a heart healthy diet. There is also a CT image screening of the heart and the coronary vessels, that we can discuss obtaining.  It risk stratifies people for coronary artery disease.  Upon the result of a study such as this, if she shows moderate to high risk, then we could get her in to see cardiology at that time.

## 2022-05-14 ENCOUNTER — Ambulatory Visit (INDEPENDENT_AMBULATORY_CARE_PROVIDER_SITE_OTHER): Payer: BC Managed Care – PPO

## 2022-05-14 DIAGNOSIS — J309 Allergic rhinitis, unspecified: Secondary | ICD-10-CM | POA: Diagnosis not present

## 2022-05-31 ENCOUNTER — Ambulatory Visit: Payer: BC Managed Care – PPO

## 2022-05-31 ENCOUNTER — Other Ambulatory Visit: Payer: Self-pay | Admitting: Family Medicine

## 2022-06-04 ENCOUNTER — Ambulatory Visit (INDEPENDENT_AMBULATORY_CARE_PROVIDER_SITE_OTHER): Payer: BC Managed Care – PPO

## 2022-06-04 DIAGNOSIS — J309 Allergic rhinitis, unspecified: Secondary | ICD-10-CM | POA: Diagnosis not present

## 2022-06-25 ENCOUNTER — Ambulatory Visit (INDEPENDENT_AMBULATORY_CARE_PROVIDER_SITE_OTHER): Payer: BC Managed Care – PPO | Admitting: *Deleted

## 2022-06-25 DIAGNOSIS — J309 Allergic rhinitis, unspecified: Secondary | ICD-10-CM

## 2022-06-27 ENCOUNTER — Ambulatory Visit (HOSPITAL_BASED_OUTPATIENT_CLINIC_OR_DEPARTMENT_OTHER): Payer: BC Managed Care – PPO | Admitting: Cardiology

## 2022-06-27 ENCOUNTER — Encounter (HOSPITAL_BASED_OUTPATIENT_CLINIC_OR_DEPARTMENT_OTHER): Payer: Self-pay | Admitting: Cardiology

## 2022-06-27 VITALS — BP 104/70 | HR 74 | Ht 65.5 in | Wt 137.1 lb

## 2022-06-27 DIAGNOSIS — R002 Palpitations: Secondary | ICD-10-CM | POA: Diagnosis not present

## 2022-06-27 DIAGNOSIS — Z7189 Other specified counseling: Secondary | ICD-10-CM | POA: Diagnosis not present

## 2022-06-27 DIAGNOSIS — Z8249 Family history of ischemic heart disease and other diseases of the circulatory system: Secondary | ICD-10-CM | POA: Diagnosis not present

## 2022-06-27 DIAGNOSIS — Z87898 Personal history of other specified conditions: Secondary | ICD-10-CM

## 2022-06-27 NOTE — Progress Notes (Signed)
Cardiology Office Note:    Date:  06/27/2022   ID:  [name] [name], DOB 03-17-1971, MRN [id number]  PCP:  [name], DO  Cardiologist:  Buford Dresser, MD  Referring MD: Drue Novel*   CC: New patient evaluation for arrhythmia  History of Present Illness:    [name] [name] is a 52 y.o. female with a hx of hyperlipidemia, hypothyroidism, Hashimoto's disease, fibromyalgia, IBS, and renal cyst, who is seen as a new consult at the request of Drue Novel* for the evaluation and management of arrhythmia.  Referral notes from Dr. Koleen Nimrod personally reviewed. She was referred for further evaluation of heart arrhythmia, SOB, and strong family history of CAD.  Today, she is accompanied by her daughter.   Cardiovascular risk factors: Prior clinical ASCVD: In [place] she reportedly was diagnosed with mitral valve prolapse, about 20 years ago. Comorbid conditions:  Hyperlipidemia - she reports that her cholesterol is high. Metabolic syndrome/Obesity: Chronic inflammatory conditions: Hashimoto's since 52 yo, she believes it was pregnancy induced. Family history: Father had brain aneurysm, died at 63 yo. Her paternal aunt had 2 strokes. Another aunt died of a heart attack. She also endorses hyperlipidemia in her paternal family. Prior pertinent testing and/or incidental findings: Coronary CT 06/2008 with coronary calcium score 0. Exercise level: She walks routinely on a treadmill for 3-5 miles. No longer able to go hiking like she used to. Usually not because of the incline, but related to difficulty breathing, feels like "her heart will pop out".  Current diet: Avoids fast food.  Racing heart rates at times. She has also been aware of palpitations that feel like a brief pause. In the past she has lost consciousness secondary to her palpitations (most recently about 2 years ago). Very sporadic. May not occur for months at a time, and at other times her  episodes are more constant. No known triggers or patterns. Previously she has been told that she has POTS.  She also complains of dizziness with positional changes, which has been an intermittent symptom throughout her life. Additionally she notes becoming dizzy or feeling strange at times such as when trying to hold her breath.  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 She was seeing multiple colors like a kaleidoscope. This lasted for 15-20 minutes before resolving spontaneously and has not recurred.  She denies any chest pain, or peripheral edema. No headaches, orthopnea, or PND.  Past Medical History:  Diagnosis Date   Angio-edema    Chicken pox    Closed displaced fracture of right great toe 09/01/2018   Constipation    Fibromyalgia    Hashimoto's disease    Hyperlipidemia    Hypothyroidism    IBS (irritable bowel syndrome)    Menopausal syndrome    Migraines    PONV (postoperative nausea and vomiting)    Renal cyst 05/06/2017   bozniak 2 cyst   Seasonal allergies    Urticaria     Past Surgical History:  Procedure Laterality Date   COLONOSCOPY  2016   DILATION AND CURETTAGE OF UTERUS  2006   missed ab   DILATION AND EVACUATION  08/14/2011   Procedure: DILATATION AND EVACUATION;  Surgeon: Logan Bores, MD;  Location: [facility];  Service: Gynecology;  Laterality: N/A;   WISDOM TOOTH EXTRACTION      Current Medications: Current Outpatient Medications on File Prior to Visit  Medication Sig   PRESCRIPTION MEDICATION Take 1 capsule  by mouth daily. Levothyroxine 28mg, Liothyronine 762m Slow Release Oral Capsule   progesterone (PROMETRIUM) 100 MG capsule Take 100 mg by mouth daily.   rizatriptan (MAXALT) 5 MG tablet Take 1-2 tablets (5-10 mg total) by mouth as needed for migraine. May repeat in 2 hours if needed   zolmitriptan (ZOMIG) 5 MG tablet Take 1 tablet (5 mg total) by mouth as needed.   No current facility-administered medications  on file prior to visit.     Allergies:   Thimerosal (thiomersal) and Pneumococcal vaccine   Social History   Tobacco Use   Smoking status: Never   Smokeless tobacco: Never  Vaping Use   Vaping Use: Never used  Substance Use Topics   Alcohol use: Yes    Comment: socially    Drug use: No    Family History: family history includes Anuerysm (age of onset: 583in her father; Arthritis in her mother; Asthma in her daughter and maternal grandmother; Depression in her brother, maternal grandmother, mother, and paternal grandfather; Diabetes in her mother; Early death (age of onset: 5882in her father; Heart disease in her maternal grandmother; Hyperlipidemia in her mother; Hypertension in her father and mother; Mental illness in her brother and mother. There is no history of Allergic rhinitis, Eczema, or Urticaria.  ROS:   Please see the history of present illness.  Additional pertinent ROS: Constitutional: Negative for chills, fever, night sweats, unintentional weight loss  HENT: Negative for ear pain and hearing loss.   Eyes: Negative for loss of vision and eye pain.  Respiratory: Negative for cough, sputum, wheezing. Positive for DOE.   Cardiovascular: See HPI. Gastrointestinal: Negative for abdominal pain, melena, and hematochezia.  Genitourinary: Negative for dysuria and hematuria.  Musculoskeletal: Negative for falls and myalgias.  Skin: Negative for itching and rash.  Neurological: Negative for focal weakness, focal sensory changes. Positive for dizziness, remote syncope. Endo/Heme/Allergies: Does not bruise/bleed easily.     EKGs/Labs/Other Studies Reviewed:    The following studies were reviewed today:  Coronary CT  07/22/2008: Final impression:  Right dominant coronary system with no significant coronary artery disease.  Coronary calcium score of 0.  No other cardiac pathology noted.  Unable to exclude small regions of noncalcified plaque. Etiology of the chest pain is likely  noncardiac, though coronary spasm cannot be excluded.    EKG:  EKG is personally reviewed.   06/27/2022:  NSR at 74 bpm  Recent Labs: 11/28/2021: ALT 12; BUN 9; Creatinine, Ser 0.66; Hemoglobin 13.9; Platelets 231.0; Potassium 4.3; Sodium 139   Recent Lipid Panel    Component Value Date/Time   CHOL 249 (H) 11/28/2021 1034   TRIG 79.0 11/28/2021 1034   HDL 71.90 11/28/2021 1034   CHOLHDL 3 11/28/2021 1034   VLDL 15.8 11/28/2021 1034   LDLCALC 162 (H) 11/28/2021 1034    Physical Exam:    VS:  BP 104/70 (BP Location: Right Arm, Patient Position: Sitting, Cuff Size: Normal)   Pulse 74   Ht 5' 5.5" (1.664 m)   Wt 137 lb 1.6 oz (62.2 kg)   BMI 22.47 kg/m     Wt Readings from Last 3 Encounters:  06/27/22 137 lb 1.6 oz (62.2 kg)  02/14/22 137 lb (62.1 kg)  11/28/21 135 lb (61.2 kg)    GEN: Well nourished, well developed in no acute distress HEENT: Normal, moist mucous membranes NECK: No JVD CARDIAC: regular rhythm, normal S1 and S2, no rubs or gallops. No murmur. VASCULAR: Radial and DP pulses 2+  bilaterally. No carotid bruits RESPIRATORY:  Clear to auscultation without rales, wheezing or rhonchi  ABDOMEN: Soft, non-tender, non-distended MUSCULOSKELETAL:  Ambulates independently SKIN: Warm and dry, no edema NEUROLOGIC:  Alert and oriented x 3. No focal neuro deficits noted. PSYCHIATRIC:  Normal affect    ASSESSMENT:    1. Heart palpitations   2. History of syncope   3. Family history of heart disease   4. Cardiac risk counseling   5. Counseling on health promotion and disease prevention    PLAN:    Palpitations Syncope -will get echo to rule out structural cardiac disease -very sporadic. ECG unremarkable. If symptoms become more frequent, will get an event monitor  Family history of heart disease -will repeat calcium score as it has been >10 years since her score of CCTA. Addended to add: calcium score of 0, low risk -counseled on red flag warning signs that need  immediate medical attention  Cardiac risk counseling and prevention recommendations: -recommend heart healthy/Mediterranean diet, with whole grains, fruits, vegetable, fish, lean meats, nuts, and olive oil. Limit salt. -recommend moderate walking, 3-5 times/week for 30-50 minutes each session. Aim for at least 150 minutes.week. Goal should be pace of 3 miles/hours, or walking 1.5 miles in 30 minutes -recommend avoidance of tobacco products. Avoid excess alcohol. -ASCVD risk score: The 10-year ASCVD risk score (Arnett DK, et al., 2019) is: 1.2%   Values used to calculate the score:     Age: 39 years     Sex: Female     Is Non-Hispanic African American: No     Diabetic: No     Tobacco smoker: No     Systolic Blood Pressure: [id number] mmHg     Is BP treated: No     HDL Cholesterol: 71.9 mg/dL     Total Cholesterol: 249 mg/dL    Plan for follow up: As needed.  Buford Dresser, MD, PhD, [facility]    Medication Adjustments/Labs and Tests Ordered: Current medicines are reviewed at length with the patient today.  Concerns regarding medicines are outlined above.   Orders Placed This Encounter  Procedures   CT CARDIAC SCORING (SELF PAY ONLY)   EKG 12-Lead   ECHOCARDIOGRAM COMPLETE   No orders of the defined types were placed in this encounter.  Patient Instructions  Medication Instructions:  Your Physician recommend you continue on your current medication as directed.    *If you need a refill on your cardiac medications before your next appointment, please call your pharmacy*  Testing/Procedures: Your physician has requested that you have an echocardiogram. Echocardiography is a painless test that uses sound waves to create images of your heart. It provides your doctor with information about the size and shape of your heart and how well your heart's chambers and valves are working. This procedure takes approximately one hour. There are no restrictions for this  procedure. Please do NOT wear cologne, perfume, aftershave, or lotions (deodorant is allowed). Please arrive 15 minutes prior to your appointment time.  Your physician has recommend you to have a coronary calcium score. This is a self pay test that will cost $99   Follow-Up: At [facility], you and your health needs are our priority.  As part of our continuing mission to provide you with exceptional heart care, we have created designated Provider Care Teams.  These Care Teams include your primary Cardiologist (physician) and Advanced Practice Providers (APPs -  Physician Assistants and Nurse Practitioners) who all work  together to provide you with the care you need, when you need it.  We recommend signing up for the patient portal called "MyChart".  Sign up information is provided on this After Visit Summary.  MyChart is used to connect with patients for Virtual Visits (Telemedicine).  Patients are able to view lab/test results, encounter notes, upcoming appointments, etc.  Non-urgent messages can be sent to your provider as well.   To learn more about what you can do with MyChart, go to [email or web address].    Your next appointment:   Call us if you need [place]!    I,Mathew Stumpf,acting as a Education administrator for [employer], MD.,have documented all relevant documentation on the behalf of Buford Dresser, MD,as directed by  Buford Dresser, MD while in the presence of Buford Dresser, MD.  I, Buford Dresser, MD, have reviewed all documentation for this visit. The documentation on 08/11/22 for the exam, diagnosis, procedures, and orders are all accurate and complete.   Signed, Buford Dresser, MD PhD 06/27/2022     [place]

## 2022-06-27 NOTE — Patient Instructions (Signed)
Medication Instructions:  Your Physician recommend you continue on your current medication as directed.    *If you need a refill on your cardiac medications before your next appointment, please call your pharmacy*  Testing/Procedures: Your physician has requested that you have an echocardiogram. Echocardiography is a painless test that uses sound waves to create images of your heart. It provides your doctor with information about the size and shape of your heart and how well your heart's chambers and valves are working. This procedure takes approximately one hour. There are no restrictions for this procedure. Please do NOT wear cologne, perfume, aftershave, or lotions (deodorant is allowed). Please arrive 15 minutes prior to your appointment time.  Your physician has recommend you to have a coronary calcium score. This is a self pay test that will cost $99   Follow-Up: At [facility], you and your health needs are our priority.  As part of our continuing mission to provide you with exceptional heart care, we have created designated Provider Care Teams.  These Care Teams include your primary Cardiologist (physician) and Advanced Practice Providers (APPs -  Physician Assistants and Nurse Practitioners) who all work together to provide you with the care you need, when you need it.  We recommend signing up for the patient portal called "MyChart".  Sign up information is provided on this After Visit Summary.  MyChart is used to connect with patients for Virtual Visits (Telemedicine).  Patients are able to view lab/test results, encounter notes, upcoming appointments, etc.  Non-urgent messages can be sent to your provider as well.   To learn more about what you can do with MyChart, go to [email or web address].    Your next appointment:   Call us if you need [place]!

## 2022-07-16 ENCOUNTER — Ambulatory Visit (INDEPENDENT_AMBULATORY_CARE_PROVIDER_SITE_OTHER): Payer: BC Managed Care – PPO

## 2022-07-16 DIAGNOSIS — J309 Allergic rhinitis, unspecified: Secondary | ICD-10-CM | POA: Diagnosis not present

## 2022-07-17 ENCOUNTER — Ambulatory Visit (INDEPENDENT_AMBULATORY_CARE_PROVIDER_SITE_OTHER): Payer: BC Managed Care – PPO

## 2022-07-17 DIAGNOSIS — R002 Palpitations: Secondary | ICD-10-CM | POA: Diagnosis not present

## 2022-07-17 DIAGNOSIS — Z87898 Personal history of other specified conditions: Secondary | ICD-10-CM | POA: Diagnosis not present

## 2022-07-17 DIAGNOSIS — R55 Syncope and collapse: Secondary | ICD-10-CM

## 2022-07-17 DIAGNOSIS — J3089 Other allergic rhinitis: Secondary | ICD-10-CM

## 2022-07-17 LAB — ECHOCARDIOGRAM COMPLETE
Area-P 1/2: 3.65 cm2
S' Lateral: 2.52 cm

## 2022-07-17 NOTE — Progress Notes (Signed)
VIALS EXP 07-18-23

## 2022-07-24 ENCOUNTER — Ambulatory Visit: Payer: BC Managed Care – PPO

## 2022-07-25 ENCOUNTER — Ambulatory Visit
Admission: RE | Admit: 2022-07-25 | Discharge: 2022-07-25 | Disposition: A | Discharge status: Home / Self Care | Payer: BC Managed Care – PPO | Source: Ambulatory Visit | Attending: Obstetrics and Gynecology | Admitting: Obstetrics and Gynecology

## 2022-07-25 DIAGNOSIS — Z139 Encounter for screening, unspecified: Secondary | ICD-10-CM

## 2022-08-05 ENCOUNTER — Ambulatory Visit (HOSPITAL_BASED_OUTPATIENT_CLINIC_OR_DEPARTMENT_OTHER)
Admission: RE | Admit: 2022-08-05 | Discharge: 2022-08-05 | Disposition: A | Discharge status: Home / Self Care | Payer: BC Managed Care – PPO | Source: Ambulatory Visit | Attending: Cardiology | Admitting: Cardiology

## 2022-08-05 DIAGNOSIS — Z8249 Family history of ischemic heart disease and other diseases of the circulatory system: Secondary | ICD-10-CM

## 2022-08-07 NOTE — Progress Notes (Signed)
Calcium score result called to patient who verbalized understanding. Georgana Curio MHA RN CCM

## 2022-08-11 ENCOUNTER — Encounter (HOSPITAL_BASED_OUTPATIENT_CLINIC_OR_DEPARTMENT_OTHER): Payer: Self-pay | Admitting: Cardiology

## 2022-08-15 ENCOUNTER — Ambulatory Visit (INDEPENDENT_AMBULATORY_CARE_PROVIDER_SITE_OTHER): Payer: BC Managed Care – PPO

## 2022-08-15 DIAGNOSIS — J309 Allergic rhinitis, unspecified: Secondary | ICD-10-CM

## 2022-09-05 ENCOUNTER — Ambulatory Visit (INDEPENDENT_AMBULATORY_CARE_PROVIDER_SITE_OTHER): Payer: BC Managed Care – PPO

## 2022-09-05 DIAGNOSIS — J309 Allergic rhinitis, unspecified: Secondary | ICD-10-CM

## 2022-09-10 ENCOUNTER — Ambulatory Visit (INDEPENDENT_AMBULATORY_CARE_PROVIDER_SITE_OTHER): Payer: BC Managed Care – PPO

## 2022-09-10 DIAGNOSIS — J309 Allergic rhinitis, unspecified: Secondary | ICD-10-CM | POA: Diagnosis not present

## 2022-09-17 ENCOUNTER — Ambulatory Visit (INDEPENDENT_AMBULATORY_CARE_PROVIDER_SITE_OTHER): Payer: BC Managed Care – PPO

## 2022-09-17 DIAGNOSIS — J309 Allergic rhinitis, unspecified: Secondary | ICD-10-CM

## 2022-09-24 ENCOUNTER — Ambulatory Visit (INDEPENDENT_AMBULATORY_CARE_PROVIDER_SITE_OTHER): Payer: BC Managed Care – PPO

## 2022-09-24 ENCOUNTER — Telehealth: Payer: Self-pay

## 2022-09-24 DIAGNOSIS — J309 Allergic rhinitis, unspecified: Secondary | ICD-10-CM | POA: Diagnosis not present

## 2022-09-24 NOTE — Telephone Encounter (Signed)
Spoke to patient and informed her our [facility] office will be closed 09/26/22.  Lanise [phone]

## 2022-10-01 ENCOUNTER — Ambulatory Visit (INDEPENDENT_AMBULATORY_CARE_PROVIDER_SITE_OTHER): Payer: BC Managed Care – PPO

## 2022-10-01 DIAGNOSIS — J309 Allergic rhinitis, unspecified: Secondary | ICD-10-CM

## 2022-10-07 ENCOUNTER — Other Ambulatory Visit: Payer: Self-pay | Admitting: Internal Medicine

## 2022-10-07 DIAGNOSIS — R1084 Generalized abdominal pain: Secondary | ICD-10-CM

## 2022-10-22 ENCOUNTER — Ambulatory Visit (INDEPENDENT_AMBULATORY_CARE_PROVIDER_SITE_OTHER): Payer: BC Managed Care – PPO

## 2022-10-22 DIAGNOSIS — J309 Allergic rhinitis, unspecified: Secondary | ICD-10-CM | POA: Diagnosis not present

## 2022-10-29 ENCOUNTER — Encounter: Payer: Self-pay | Admitting: Internal Medicine

## 2022-11-05 ENCOUNTER — Encounter: Payer: Self-pay | Admitting: Internal Medicine

## 2022-11-07 ENCOUNTER — Ambulatory Visit
Admission: RE | Admit: 2022-11-07 | Discharge: 2022-11-07 | Disposition: A | Discharge status: Home / Self Care | Payer: BC Managed Care – PPO | Source: Ambulatory Visit | Attending: Internal Medicine | Admitting: Internal Medicine

## 2022-11-07 DIAGNOSIS — R1084 Generalized abdominal pain: Secondary | ICD-10-CM

## 2022-11-07 MED ORDER — IOPAMIDOL (ISOVUE-300) INJECTION 61%
100.0000 mL | Freq: Once | INTRAVENOUS | Status: AC | PRN
  Administered 2022-11-07: 100 mL via INTRAVENOUS

## 2022-11-14 ENCOUNTER — Ambulatory Visit (INDEPENDENT_AMBULATORY_CARE_PROVIDER_SITE_OTHER): Payer: BC Managed Care – PPO

## 2022-11-14 DIAGNOSIS — J309 Allergic rhinitis, unspecified: Secondary | ICD-10-CM | POA: Diagnosis not present

## 2022-12-03 ENCOUNTER — Ambulatory Visit (INDEPENDENT_AMBULATORY_CARE_PROVIDER_SITE_OTHER): Payer: BC Managed Care – PPO

## 2022-12-03 DIAGNOSIS — J309 Allergic rhinitis, unspecified: Secondary | ICD-10-CM

## 2022-12-04 NOTE — Progress Notes (Signed)
VIALS EXP 12-04-23

## 2022-12-05 DIAGNOSIS — J3089 Other allergic rhinitis: Secondary | ICD-10-CM | POA: Diagnosis not present

## 2022-12-06 ENCOUNTER — Ambulatory Visit: Payer: BC Managed Care – PPO | Admitting: Family Medicine

## 2022-12-24 ENCOUNTER — Ambulatory Visit (INDEPENDENT_AMBULATORY_CARE_PROVIDER_SITE_OTHER): Payer: BC Managed Care – PPO

## 2022-12-24 DIAGNOSIS — J309 Allergic rhinitis, unspecified: Secondary | ICD-10-CM

## 2023-01-16 ENCOUNTER — Ambulatory Visit (INDEPENDENT_AMBULATORY_CARE_PROVIDER_SITE_OTHER): Payer: BC Managed Care – PPO

## 2023-01-16 DIAGNOSIS — J309 Allergic rhinitis, unspecified: Secondary | ICD-10-CM | POA: Diagnosis not present

## 2023-02-04 ENCOUNTER — Ambulatory Visit (INDEPENDENT_AMBULATORY_CARE_PROVIDER_SITE_OTHER): Payer: BC Managed Care – PPO

## 2023-02-04 DIAGNOSIS — J309 Allergic rhinitis, unspecified: Secondary | ICD-10-CM

## 2023-02-17 NOTE — Progress Notes (Deleted)
Follow Up Note  RE: PATSIE PROCHAZKA MRN: [id number] DOB: 21-May-1971 Date of Office Visit: 02/18/2023  Referring provider: Natalia Leatherwood, DO Primary care provider: Natalia Leatherwood, DO  Chief Complaint: No chief complaint on file.  History of Present Illness: I had the pleasure of seeing Annmarie Cherek for a follow up visit at the [facility] on 02/17/2023. She is a 52 y.o. female, who is being followed for allergic rhinitis on AIT. Her previous allergy office visit was on 02/14/2022 with Dr. Selena Batten. Today is a regular follow up visit.  Seasonal and perennial allergic rhinitis Past history - Perennial rhinitis symptoms for many years.  The last few years noting postnasal drip and voice changes with throat clearing.  Patient used to be on allergy injections as a child and as an adult with some benefit.  Evaluated by ENT in the past as well.  Tried over-the-counter antihistamines, Flonase, Dymista and Singulair with no benefit.  Denies heartburn/reflux symptoms. 2021 skin testing showed: Positive to grass, ragweed, trees, mold, dust mites, cat and cockroach. Borderline to weeds, horse. Negative dog, rabbit and [place] pigs.  Interim history - started AIT on 05/16/2020 (G-RW-W-T and M-DM-C-CR) with no issues. Since then had vocal fold lipo augmentation in May 2022 by Dr. Rubye Oaks with great benefit.  Continue environmental control measures. Continue allergy injections - given today. Will discuss stopping AIT in 1 year at next visit.  Use over the counter antihistamines such as Zyrtec (cetirizine), Claritin (loratadine), Allegra (fexofenadine), or Xyzal (levocetirizine) daily as needed. May switch antihistamines every few months.   Return in about 1 year (around 02/15/2023).  Assessment and Plan: Dannisha is a 52 y.o. female with: Seasonal allergic rhinitis due to pollen Allergic rhinitis due to animal dander Allergic rhinitis due to dust mite Allergic rhinitis due to  mold Allergy to cockroaches Past history - Patient used to be on allergy injections as a child and as an adult with some benefit.  Evaluated by ENT in the past as well.  Tried over-the-counter antihistamines, Flonase, Dymista and Singulair with no benefit.  2021 skin testing showed: Positive to grass, ragweed, trees, mold, dust mites, cat and cockroach. Borderline to weeds, horse. Negative dog, rabbit and [place] pigs.  Interim history - started AIT on 05/16/2020 (G-RW-W-T and M-DM-C-CR) with no issues. Since then had vocal fold lipo augmentation in May 2022 by Dr. Rubye Oaks with great benefit.  Continue environmental control measures. Continue allergy injections - given today. Will discuss stopping AIT in 1 year at next visit.  Use over the counter antihistamines such as Zyrtec (cetirizine), Claritin (loratadine), Allegra (fexofenadine), or Xyzal (levocetirizine) daily as needed. May switch antihistamines every few months.   Return in about 1 year (around 02/15/2023).  No follow-ups on file.  No orders of the defined types were placed in this encounter.  Lab Orders  No laboratory test(s) ordered today    Diagnostics: Spirometry:  Tracings reviewed. Her effort: {Blank single:19197::"Good reproducible efforts.","It was hard to get consistent efforts and there is a question as to whether this reflects a maximal maneuver.","Poor effort, data can not be interpreted."} FVC: ***L FEV1: ***L, ***% predicted FEV1/FVC ratio: ***% Interpretation: {Blank single:19197::"Spirometry consistent with mild obstructive disease","Spirometry consistent with moderate obstructive disease","Spirometry consistent with severe obstructive disease","Spirometry consistent with possible restrictive disease","Spirometry consistent with mixed obstructive and restrictive disease","Spirometry uninterpretable due to technique","Spirometry consistent with normal pattern","No overt abnormalities noted given today's efforts"}.   Please see scanned spirometry results for details.  Skin Testing: {Blank single:19197::"Select foods","Environmental allergy panel","Environmental allergy panel and select foods","Food allergy panel","None","Deferred due to recent antihistamines use"}. *** Results discussed with patient/family.   Medication List:  Current Outpatient Medications  Medication Sig Dispense Refill   hydrOXYzine (VISTARIL) 25 MG capsule Take 1 capsule by mouth as needed for anxiety.     PRESCRIPTION MEDICATION Take 1 capsule by mouth daily. Levothyroxine , Liothyronine Slow Release Oral Capsule     progesterone (PROMETRIUM) 100 MG capsule Take 100 mg by mouth daily.     rizatriptan (MAXALT) 5 MG tablet Take 1-2 tablets (5-10 mg total) by mouth as needed for migraine. May repeat in 2 hours if needed 10 tablet 11   zolmitriptan (ZOMIG) 5 MG tablet Take 1 tablet (5 mg total) by mouth as needed. 10 tablet 5   No current facility-administered medications for this visit.   Allergies: Allergies  Allergen Reactions   Thimerosal (Thiomersal) Hives   Pneumococcal Vaccine Hives, Itching, Nausea Only and Rash   I reviewed her past medical history, social history, family history, and environmental history and no significant changes have been reported from her previous visit.  Review of Systems  Constitutional:  Negative for appetite change, chills, fever and unexpected weight change.  HENT:  Negative for congestion, postnasal drip, rhinorrhea, sore throat and voice change.   Eyes:  Negative for itching.  Respiratory:  Negative for cough, chest tightness, shortness of breath and wheezing.   Cardiovascular:  Negative for chest pain.  Gastrointestinal:  Negative for abdominal pain.  Genitourinary:  Negative for difficulty urinating.  Skin:  Negative for rash.  Allergic/Immunologic: Positive for environmental allergies.  Neurological:  Negative for headaches.    Objective: There were no vitals taken for  this visit. There is no height or weight on file to calculate BMI. Physical Exam Vitals and nursing note reviewed.  Constitutional:      Appearance: Normal appearance. She is well-developed.  HENT:     Head: Normocephalic and atraumatic.     Right Ear: Tympanic membrane and external ear normal.     Left Ear: Tympanic membrane and external ear normal.     Nose: Nose normal.     Mouth/Throat:     Mouth: Mucous membranes are moist.     Pharynx: Oropharynx is clear.  Eyes:     Conjunctiva/sclera: Conjunctivae normal.  Cardiovascular:     Rate and Rhythm: Normal rate and regular rhythm.     Heart sounds: Normal heart sounds. No murmur heard.    No friction rub. No gallop.  Pulmonary:     Effort: Pulmonary effort is normal.     Breath sounds: Normal breath sounds. No wheezing, rhonchi or rales.  Abdominal:     Palpations: Abdomen is soft.  Musculoskeletal:     Cervical back: Neck supple.  Skin:    General: Skin is warm.     Findings: No rash.  Neurological:     Mental Status: She is alert and oriented to person, place, and time.  Psychiatric:        Behavior: Behavior normal.    Previous notes and tests were reviewed. The plan was reviewed with the patient/family, and all questions/concerned were addressed.  It was my pleasure to see Kortnee today and participate in her care. Please feel free to contact me with any questions or concerns.  Sincerely,  Wyline Mood, DO Allergy & Immunology  [facility] office: [phone] [facility] office: [phone]

## 2023-02-18 ENCOUNTER — Ambulatory Visit: Payer: BC Managed Care – PPO | Admitting: Allergy

## 2023-02-27 ENCOUNTER — Ambulatory Visit: Payer: BC Managed Care – PPO | Admitting: Allergy

## 2023-02-27 ENCOUNTER — Ambulatory Visit (INDEPENDENT_AMBULATORY_CARE_PROVIDER_SITE_OTHER): Payer: BC Managed Care – PPO

## 2023-02-27 DIAGNOSIS — J309 Allergic rhinitis, unspecified: Secondary | ICD-10-CM | POA: Diagnosis not present

## 2023-03-06 ENCOUNTER — Ambulatory Visit (INDEPENDENT_AMBULATORY_CARE_PROVIDER_SITE_OTHER): Payer: BC Managed Care – PPO

## 2023-03-06 DIAGNOSIS — J309 Allergic rhinitis, unspecified: Secondary | ICD-10-CM | POA: Diagnosis not present

## 2023-03-13 ENCOUNTER — Ambulatory Visit (INDEPENDENT_AMBULATORY_CARE_PROVIDER_SITE_OTHER): Payer: BC Managed Care – PPO

## 2023-03-13 DIAGNOSIS — J309 Allergic rhinitis, unspecified: Secondary | ICD-10-CM | POA: Diagnosis not present

## 2023-03-25 ENCOUNTER — Ambulatory Visit (INDEPENDENT_AMBULATORY_CARE_PROVIDER_SITE_OTHER): Payer: BC Managed Care – PPO

## 2023-03-25 DIAGNOSIS — J309 Allergic rhinitis, unspecified: Secondary | ICD-10-CM

## 2023-04-01 ENCOUNTER — Ambulatory Visit (INDEPENDENT_AMBULATORY_CARE_PROVIDER_SITE_OTHER): Payer: BC Managed Care – PPO

## 2023-04-01 DIAGNOSIS — J309 Allergic rhinitis, unspecified: Secondary | ICD-10-CM

## 2023-04-09 ENCOUNTER — Other Ambulatory Visit: Payer: Self-pay | Admitting: Obstetrics and Gynecology

## 2023-04-09 DIAGNOSIS — Z1231 Encounter for screening mammogram for malignant neoplasm of breast: Secondary | ICD-10-CM

## 2023-05-06 ENCOUNTER — Ambulatory Visit (INDEPENDENT_AMBULATORY_CARE_PROVIDER_SITE_OTHER): Payer: BC Managed Care – PPO

## 2023-05-06 DIAGNOSIS — J309 Allergic rhinitis, unspecified: Secondary | ICD-10-CM | POA: Diagnosis not present

## 2023-05-09 IMAGING — MG MM DIGITAL SCREENING BILAT W/ TOMO AND CAD
8 series · 9 of 24 positions shown · non-contrast
Comparison: Previous exam(s).

CLINICAL DATA: Screening.

EXAM:
DIGITAL SCREENING BILATERAL MAMMOGRAM WITH TOMOSYNTHESIS AND CAD
TECHNIQUE: Bilateral screening digital craniocaudal and mediolateral oblique
mammograms were obtained. Bilateral screening digital breast
tomosynthesis was performed. The images were evaluated with
computer-aided detection.

[R CC synth-2D]
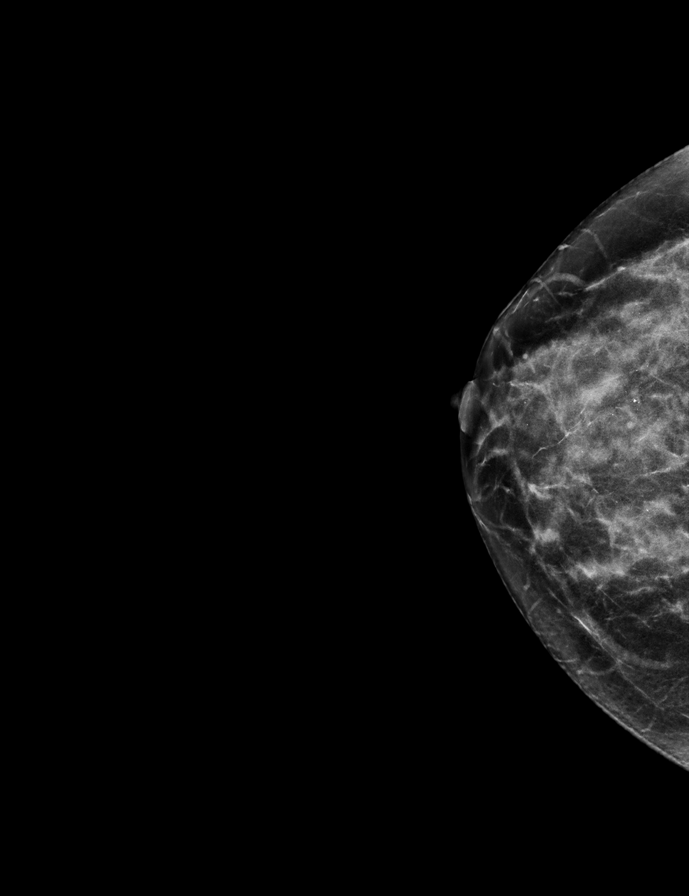

[L MLO synth-2D]
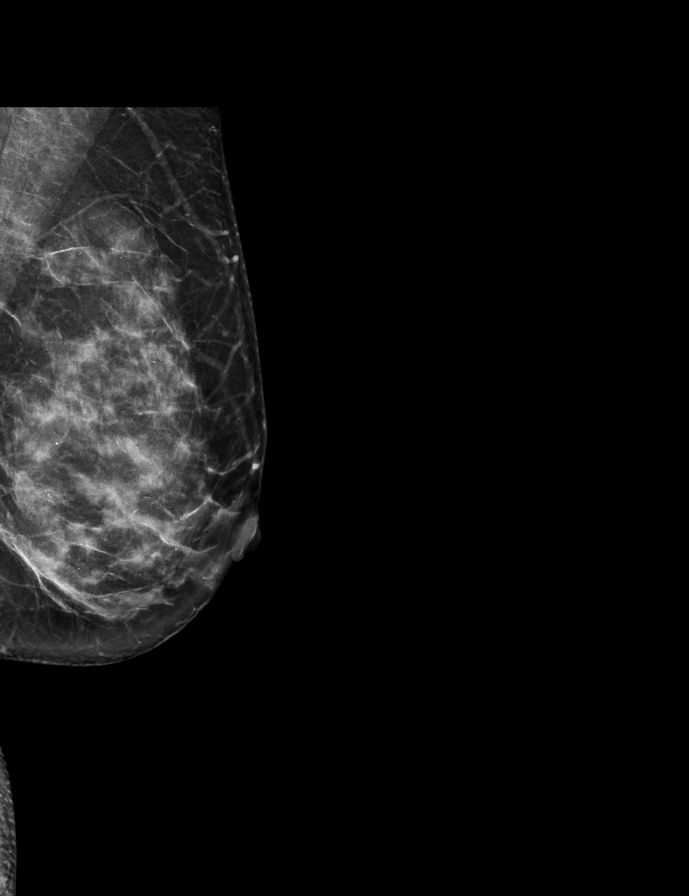

[L CC synth-2D]
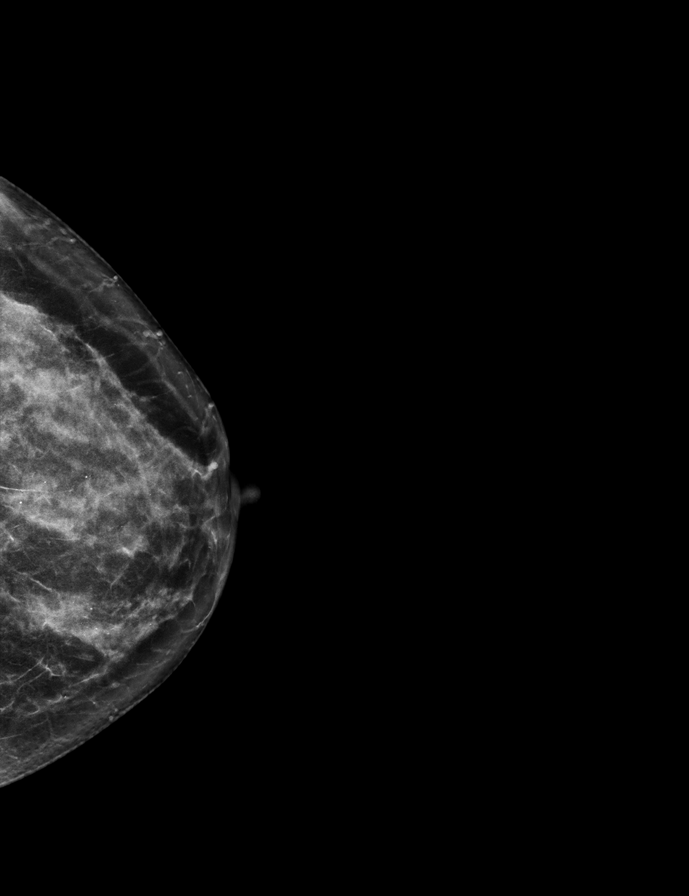

[R MLO synth-2D]
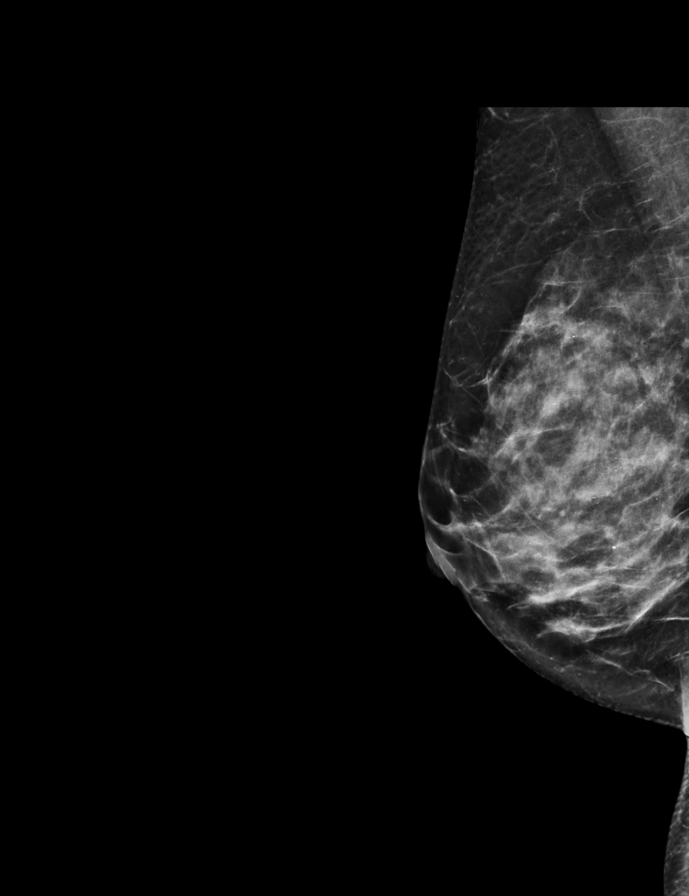

[R MLO tomo · 2 of 58 frames shown]
[frame 19/58]
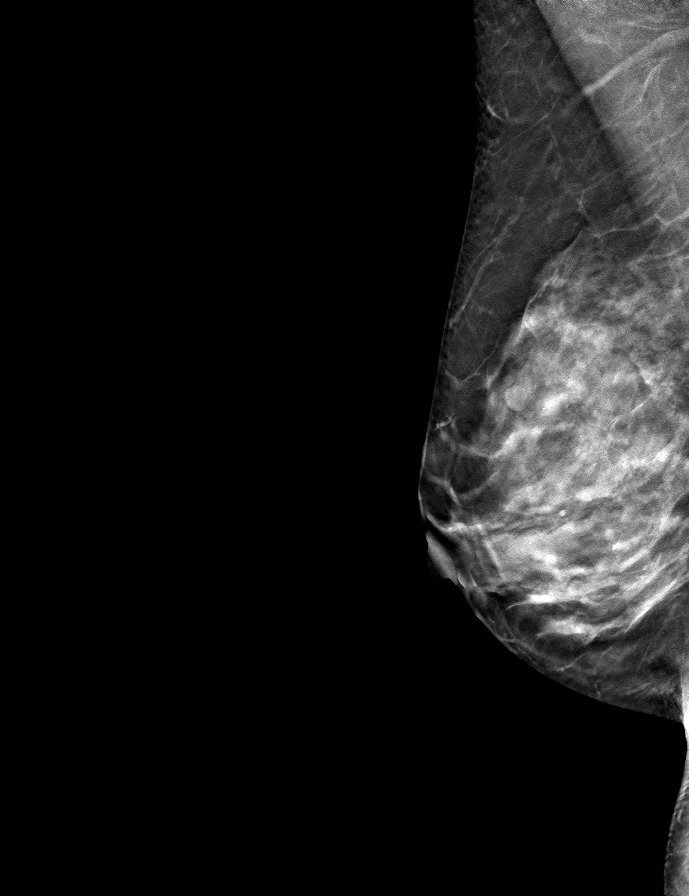
[frame 29/58]
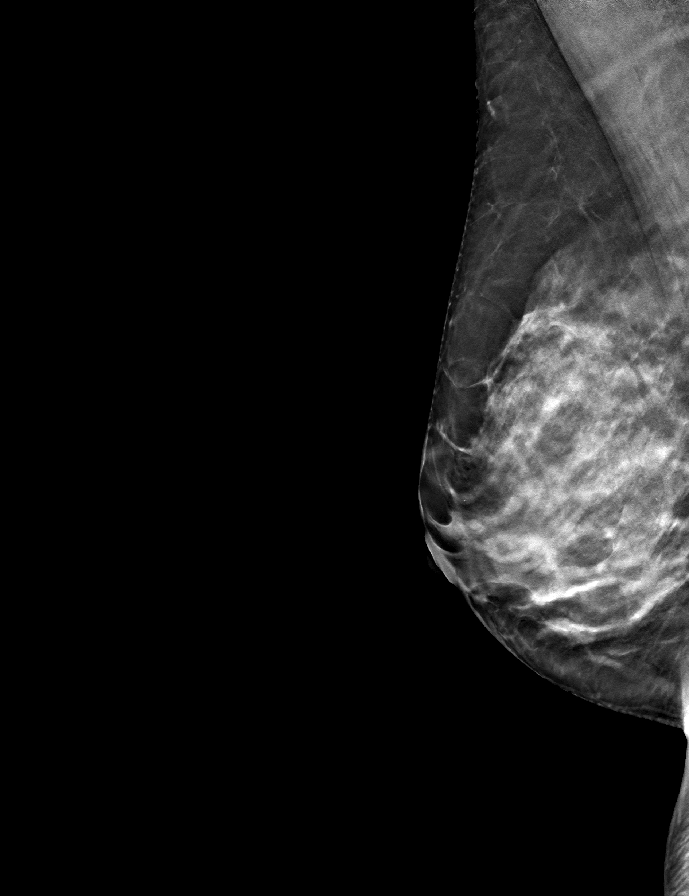

[R CC tomo · tomo slice 27/54.0]
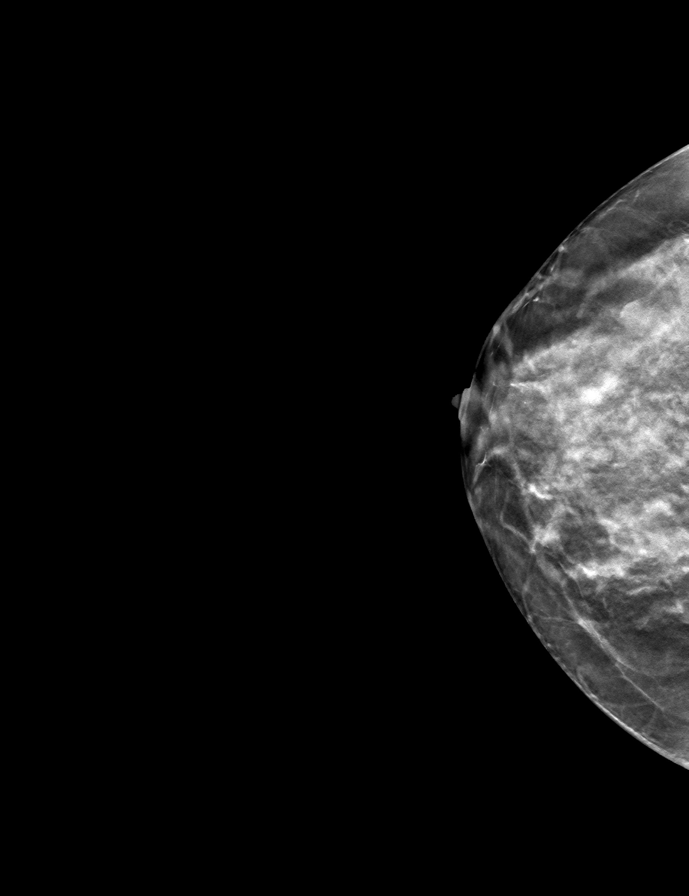

[L MLO tomo · tomo slice 32/63.0]
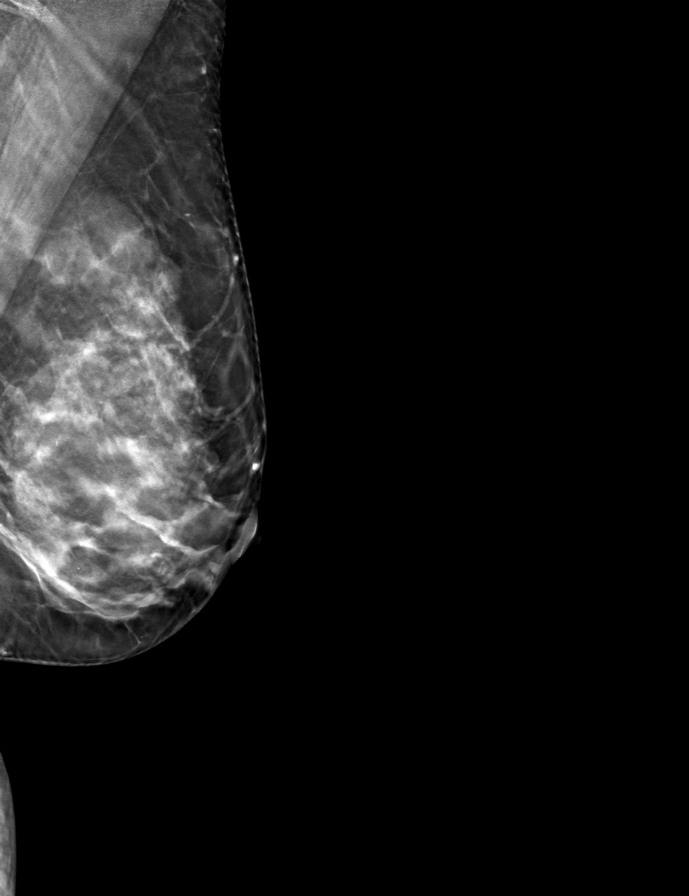

[L CC tomo · tomo slice 31/60.0]
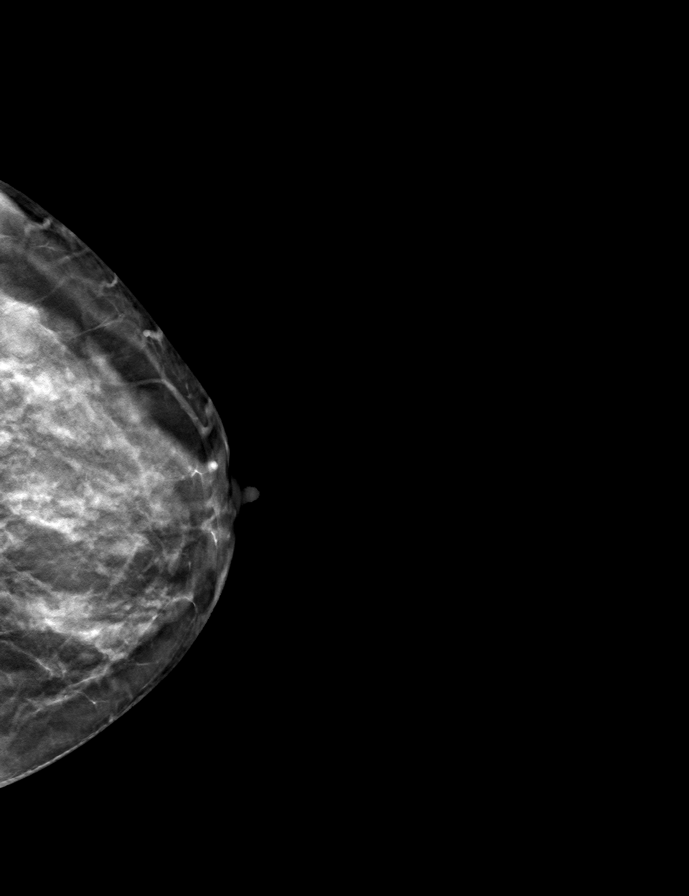

[9 of 24 positions shown; findings below may reference images not displayed]

ACR Breast Density Category c: The breast tissue is heterogeneously
dense, which may obscure small masses.
FINDINGS: There are no findings suspicious for malignancy.
IMPRESSION: No mammographic evidence of malignancy. A result letter of this
screening mammogram will be mailed directly to the patient.

RECOMMENDATION:
Screening mammogram in one year. (Code:Q3-W-BC3)

BI-RADS CATEGORY  1: Negative.

## 2023-06-03 ENCOUNTER — Ambulatory Visit (INDEPENDENT_AMBULATORY_CARE_PROVIDER_SITE_OTHER): Payer: BC Managed Care – PPO

## 2023-06-03 DIAGNOSIS — J309 Allergic rhinitis, unspecified: Secondary | ICD-10-CM

## 2023-07-01 ENCOUNTER — Ambulatory Visit (INDEPENDENT_AMBULATORY_CARE_PROVIDER_SITE_OTHER): Payer: 59

## 2023-07-01 DIAGNOSIS — J309 Allergic rhinitis, unspecified: Secondary | ICD-10-CM | POA: Diagnosis not present

## 2023-07-07 ENCOUNTER — Other Ambulatory Visit: Payer: Self-pay | Admitting: Obstetrics and Gynecology

## 2023-07-07 DIAGNOSIS — N6320 Unspecified lump in the left breast, unspecified quadrant: Secondary | ICD-10-CM

## 2023-07-28 ENCOUNTER — Ambulatory Visit: Payer: BC Managed Care – PPO

## 2023-07-29 ENCOUNTER — Ambulatory Visit
Admission: RE | Admit: 2023-07-29 | Discharge: 2023-07-29 | Disposition: A | Discharge status: Home / Self Care | Payer: 59 | Source: Ambulatory Visit | Attending: Obstetrics and Gynecology | Admitting: Obstetrics and Gynecology

## 2023-07-29 ENCOUNTER — Ambulatory Visit (INDEPENDENT_AMBULATORY_CARE_PROVIDER_SITE_OTHER): Payer: 59

## 2023-07-29 DIAGNOSIS — N6320 Unspecified lump in the left breast, unspecified quadrant: Secondary | ICD-10-CM

## 2023-07-29 DIAGNOSIS — J309 Allergic rhinitis, unspecified: Secondary | ICD-10-CM | POA: Diagnosis not present

## 2023-08-20 ENCOUNTER — Ambulatory Visit: Payer: 59 | Admitting: Family Medicine

## 2023-08-20 ENCOUNTER — Encounter: Payer: Self-pay | Admitting: Family Medicine

## 2023-08-20 VITALS — BP 106/78 | HR 90 | Temp 97.9°F | Wt 126.4 lb

## 2023-08-20 DIAGNOSIS — G43809 Other migraine, not intractable, without status migrainosus: Secondary | ICD-10-CM

## 2023-08-20 MED ORDER — ZOLMITRIPTAN 5 MG PO TABS: 5.0000 mg | ORAL_TABLET | ORAL | 11 refills | Status: AC | PRN

## 2023-08-20 NOTE — Patient Instructions (Addendum)
 Return in about 1 year (around 08/20/2024) for Routine chronic condition follow-up.        Great to see you today.  I have refilled the medication(s) we provide.   If labs were collected or images ordered, we will inform you of  results once we have received them and reviewed. We will contact you either by echart message, or telephone call.  Please give ample time to the testing facility, and our office to run,  receive and review results. Please do not call inquiring of results, even if you can see them in your chart. We will contact you as soon as we are able. If it has been over 1 week since the test was completed, and you have not yet heard from [place], then please call us.    - echart message- for normal results that have been seen by the patient already.   - telephone call: abnormal results or if patient has not viewed results in their echart.  If a referral to a specialist was entered for you, please call us in 2 weeks if you have not heard from the specialist office to schedule.

## 2023-08-20 NOTE — Progress Notes (Signed)
 Patient ID: [name] [name], female  DOB: 08-Feb-1971, 53 y.o.   MRN: [id number] Patient Care Team    Relationship Specialty Notifications Start End  [name] Leatherwood, DO PCP - General Family Medicine  08/23/19   [name] Red, MD PCP - Cardiology Cardiology  06/27/22   [name] Riffle, MD Consulting Physician Cardiology  08/23/19   [name] Modena, MD Consulting Physician Gastroenterology  08/23/19   [name] [name], OD Referring Physician   08/23/19   [name] Ralphs, MD Referring Physician Endocrinology  08/23/19   [name], [name] Huge Consulting Physician Ophthalmology  11/22/21   [name] Cote, MD Consulting Physician Obstetrics and Gynecology  11/28/21   [name] Loop, DO  Otolaryngology  11/28/21   [name] Sia, DO Consulting Physician Allergy  11/28/21     Chief Complaint  Patient presents with   Migraine    Subjective:  [name] [name] is a 53 y.o.  Female  present for Chronic Conditions/illness Management  All past medical history, surgical history, allergies, family history, immunizations, medications and social history were updated in the electronic medical record today. All recent labs, ED visits and hospitalizations within the last year were reviewed.  migraine without status migrainosus, not intractable Patient reports she has a chronic migraine history.   Over the years she typically averages about 3 migraines a month.   She has used for Zomig for her migraines routinely.  Hypothyroidism due to Hashimoto's thyroiditis Patient is followed by endocrinology for her condition.     08/20/2023    2:43 PM 11/28/2021    9:56 AM 08/20/2019   10:04 AM  Depression screen PHQ 2/9  Decreased Interest 0 1 0  Down, Depressed, Hopeless 0 1 0  PHQ - 2 Score 0 2 0  Altered sleeping 1    Tired, decreased energy 1    Change in appetite 0    Feeling bad or failure about yourself  0    Trouble concentrating 0    Moving slowly or fidgety/restless 0    Suicidal thoughts 0     PHQ-9 Score 2    Difficult doing work/chores Not difficult at all        08/20/2023    2:43 PM  GAD 7 : Generalized Anxiety Score  Nervous, Anxious, on Edge 0  Control/stop worrying 0  Worry too much - different things 0  Trouble relaxing 0  Restless 0  Easily annoyed or irritable 0  Afraid - awful might happen 0  Total GAD 7 Score 0  Anxiety Difficulty Not difficult at all     Immunization History  Administered Date(s) Administered   Hepatitis A 04/03/2018   Hepatitis A, Adult 04/03/2018   Hepatitis B 04/03/2018, 06/04/2018   Hepatitis B, ADULT 04/03/2018, 06/04/2018   Influenza Inj Mdck Quad Pf 02/15/2018   Influenza Split 02/14/2017   Influenza,inj,Quad PF,6+ Mos 02/23/2020, 02/22/2021, 03/06/2022   Influenza-Unspecified 02/23/2019, 02/24/2023   PFIZER Comirnaty(Gray Top)Covid-19 Tri-Sucrose Vaccine 01/04/2021   Rho (D) Immune Globulin 08/14/2011   Td 01/22/2009   Tdap 04/03/2018   Typhoid Live 04/06/2018   Zoster Recombinant(Shingrix) 02/15/2019, 05/24/2019     Past Medical History:  Diagnosis Date   Angio-edema    Chicken pox    Closed displaced fracture of right great toe 09/01/2018   Constipation    Fibromyalgia    Hashimoto's disease    Hyperlipidemia    Hypothyroidism    IBS (irritable bowel syndrome)    Laryngeal spasm 11/28/2021  Dr. 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   Allergies  Allergen Reactions   Thimerosal (Thiomersal) Hives   Pneumococcal Vaccine Hives, Itching, Nausea Only and Rash   Past Surgical History:  Procedure Laterality Date   COLONOSCOPY  2016   DILATION AND CURETTAGE OF UTERUS  2006   missed ab   DILATION AND EVACUATION  08/14/2011    Procedure: DILATATION AND EVACUATION;  Surgeon: Oliver Pila, MD;  Location: [facility];  Service: Gynecology;  Laterality: N/A;   WISDOM TOOTH EXTRACTION     Family History  Problem Relation Age of Onset   Hypertension Mother    Arthritis Mother    Depression Mother    Diabetes Mother    Hyperlipidemia Mother    Mental illness Mother    Hypertension Father    Early death Father 78   Anuerysm Father 96       brain   Depression Brother    Mental illness Brother    Asthma Maternal Grandmother    Depression Maternal Grandmother    Heart disease Maternal Grandmother    Depression Paternal Grandfather    Asthma Daughter    Allergic rhinitis Neg Hx    Eczema Neg Hx    Urticaria Neg Hx    Social History   Social History Narrative   Marital status/children/pets: Married.  5 children.   Education/employment: College educated.  Works as an Secondary school teacher.   Safety:      -smoke alarm in the home:Yes     - wears seatbelt: Yes     - Feels safe in their relationships: Yes       Allergies as of 08/20/2023       Reactions   Thimerosal (thiomersal) Hives   Pneumococcal Vaccine Hives, Itching, Nausea Only, Rash        Medication List        Accurate as of August 20, 2023  2:58 PM. If you have any questions, ask your nurse or doctor.          STOP taking these medications    cholecalciferol 25 MCG (1000 UNIT) tablet Commonly known as: VITAMIN D3 Stopped by: Felix Pacini   hydrOXYzine 25 MG capsule Commonly known as: VISTARIL Stopped by: Felix Pacini   levothyroxine 88 MCG tablet Commonly known as: SYNTHROID Stopped by: Felix Pacini   liothyronine 5 MCG tablet Commonly known as: CYTOMEL Stopped by: Felix Pacini   Lomaira 8 MG Tabs Generic drug: Phentermine HCl Stopped by: Felix Pacini   rizatriptan 5 MG tablet Commonly known as: MAXALT Stopped by: Felix Pacini       TAKE these medications    PRESCRIPTION MEDICATION Take 1 capsule by mouth daily.  Levothyroxine , Liothyronine Slow Release Oral Capsule   progesterone 100 MG capsule Commonly known as: PROMETRIUM Take 100 mg by mouth daily.   zolmitriptan 5 MG tablet Commonly known as: ZOMIG Take 1 tablet (5 mg total) by mouth as needed.        All past medical history, surgical history, allergies, family history, immunizations andmedications were updated in the EMR today and reviewed under the history and medication portions of their EMR.  No results found for this or any previous visit (from the past 2160 hours).   ROS 14 pt review of systems performed and negative (unless mentioned in an HPI)  Objective: BP 106/78   Pulse 90   Temp 97.9 F (36.6 C)   Wt 126 lb 6.4 oz (57.3 kg)   SpO2 99%   BMI 20.71 kg/m  Physical Exam Vitals and nursing note reviewed.  Constitutional:      General: She is not in acute distress.    Appearance: Normal appearance. She is normal weight. She is not ill-appearing or toxic-appearing.  HENT:     Head: Normocephalic and atraumatic.  Eyes:     General: No scleral icterus.       Right eye: No discharge.        Left eye: No discharge.     Extraocular Movements: Extraocular movements intact.     Conjunctiva/sclera: Conjunctivae normal.     Pupils: Pupils are equal, round, and reactive to light.  Skin:    Findings: No rash.  Neurological:     Mental Status: She is alert and oriented to person, place, and time. Mental status is at baseline.     Motor: No weakness.     Coordination: Coordination normal.     Gait: Gait normal.  Psychiatric:        Mood and Affect: Mood normal.        Behavior: Behavior normal.        Thought Content: Thought content normal.        Judgment: Judgment normal.     No results found.  Assessment/plan: RYLEEANN URQUIZA is a 53 y.o. female present for Chronic Conditions/illness Management migraine without status migrainosus, not intractable/vegetarian/sleep disturbance/family history of  brain aneurysm in her father Encourage patient to start B12 over-the-counter supplementation and magnesium. Continue Zomig as needed. She also wanted to try Maxalt.  This was called in as well.  Patient is aware insurance may elect not to pay for both Zomig and Maxalt. Continue Vistaril nightly as needed Family history of brain aneurysm in her father, low threshold of imaging if not done prior.  Hypothyroidism due to Hashimoto's thyroiditis Follows with endocrinology.  We will continue to follow with endocrinology.  Currently uses a compounded medication.   Laryngeal spasm/Vocal fold atrophy She is following with multiple specialists in otolaryngology for condition.  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  Return in about 1 year (around 08/20/2024) for Routine chronic condition follow-up.  No orders of the defined types were placed in this encounter.  Meds ordered this encounter  Medications   zolmitriptan (ZOMIG) 5 MG tablet    Sig: Take 1 tablet (5 mg total) by mouth as needed.    Dispense:  10 tablet    Refill:  11   Referral Orders  No referral(s) requested today     Electronically signed by: Felix Pacini, DO [facility]

## 2023-08-26 ENCOUNTER — Ambulatory Visit (INDEPENDENT_AMBULATORY_CARE_PROVIDER_SITE_OTHER)

## 2023-08-26 DIAGNOSIS — J309 Allergic rhinitis, unspecified: Secondary | ICD-10-CM | POA: Diagnosis not present

## 2023-09-30 ENCOUNTER — Ambulatory Visit (INDEPENDENT_AMBULATORY_CARE_PROVIDER_SITE_OTHER)

## 2023-09-30 DIAGNOSIS — J309 Allergic rhinitis, unspecified: Secondary | ICD-10-CM | POA: Diagnosis not present

## 2024-01-05 ENCOUNTER — Encounter: Payer: Self-pay | Admitting: Podiatry

## 2024-01-05 ENCOUNTER — Ambulatory Visit: Admitting: Podiatry

## 2024-01-05 DIAGNOSIS — L600 Ingrowing nail: Secondary | ICD-10-CM | POA: Diagnosis not present

## 2024-01-05 DIAGNOSIS — B351 Tinea unguium: Secondary | ICD-10-CM

## 2024-01-05 NOTE — Patient Instructions (Signed)

## 2024-01-05 NOTE — Progress Notes (Unsigned)
 Subjective:   Patient ID: [name] [name], female   DOB: 53 y.o.   MRN: [id number]   HPI Chief Complaint  Patient presents with   Nail Problem    Hallux right - thick, brittle, yellow x years, notice the other nails look a little yellow now too, some of the nail of hallux right has fallen off, PCP said the yellow was age, husband has had fungus   New Patient (Initial Visit)    Est pt 245    53 year old female presents today with the above concerns.  She states that the right big toenails been thick and discolored on the for many years.  She said the nail is not growing.  Part of the big toenails,.   Review of Systems  All other systems reviewed and are negative.  Past Medical History:  Diagnosis Date   Angio-edema    Chicken pox    Closed displaced fracture of right great toe 09/01/2018   Constipation    Fibromyalgia    Hashimoto's disease    Hyperlipidemia    Hypothyroidism    IBS (irritable bowel syndrome)    Laryngeal spasm 11/28/2021   Dr. 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    Past Surgical History:  Procedure Laterality Date   COLONOSCOPY  2016   DILATION AND CURETTAGE OF UTERUS  2006   missed ab   DILATION AND EVACUATION  08/14/2011   Procedure: DILATATION AND EVACUATION;  Surgeon: Nathanel LELON Bunker, MD;  Location: [facility];  Service: Gynecology;  Laterality: N/A;   WISDOM TOOTH EXTRACTION       Current Outpatient Medications:    eszopiclone (LUNESTA) 2 MG TABS tablet, Take 2 mg by mouth daily as needed., Disp: , Rfl:    levothyroxine (SYNTHROID) 75 MCG tablet, Take 75 mcg by mouth daily., Disp: , Rfl:    liothyronine (CYTOMEL) 5 MCG tablet, Take by mouth., Disp: , Rfl:     metFORMIN (GLUCOPHAGE-XR) 500 MG 24 hr tablet, Take 500 mg by mouth daily., Disp: , Rfl:    XIFAXAN 550 MG TABS tablet, Take 550 mg by mouth 3 (three) times daily., Disp: , Rfl:    PRESCRIPTION MEDICATION, Take 1 capsule by mouth daily. Levothyroxine 60mcg, Liothyronine 7mcg Slow Release Oral Capsule, Disp: , Rfl:    progesterone (PROMETRIUM) 100 MG capsule, Take 100 mg by mouth daily., Disp: , Rfl:    zolmitriptan  (ZOMIG ) 5 MG tablet, Take 1 tablet (5 mg total) by mouth as needed., Disp: 10 tablet, Rfl: 11  Allergies  Allergen Reactions   Thimerosal (Thiomersal) Hives   Pneumococcal Vaccine Hives, Itching, Nausea Only and Rash           Objective:  Physical Exam  General: AAO x3, NAD  Dermatological: Most of the right hallux toenail is most hypertrophic, dystrophic with yellow, brown discoloration but left hallux as well as bilateral second toes are also dystrophic and discolored mild.  Right hallux toenail is loose and there is ingrowing along the borders.  No edema or any erythema or signs of infection.  Vascular: Dorsalis Pedis artery and Posterior Tibial artery pedal pulses are 2/4 bilateral with immedate capillary fill time.  There is no pain with calf compression, swelling, warmth, erythema.   Neruologic: Grossly intact via light touch bilateral.   Musculoskeletal: No other areas of discomfort.  Gait: Unassisted, Nonantalgic.       Assessment:   Hallux ingrown toenail, onychodystrophy     Plan:  -Treatment options discussed including all alternatives, risks, and complications -Etiology of symptoms were discussed -At this time, we mutually agreed to proceed with total nail removal without chemical matricectomy to the right hallux. Risks and complications were discussed with the patient for which they understand and  verbally consent to the procedure. Under sterile conditions a total of 3 mL of a mixture of 2% lidocaine  plain and 0.5% Marcaine plain was infiltrated in a  hallux block fashion. Once anesthetized, the skin was prepped in sterile fashion. A tourniquet was then applied. Next the right hallux nail was removed in total making to remove all nail borders.  Once the nail was  Removed, the area was debrided and the underlying skin was intact. The area was irrigated and hemostasis was obtained.  A dry sterile dressing was applied. After application of the dressing the tourniquet was removed and there is found to be an immediate capillary refill time to the digit. The patient tolerated the procedure well any complications. Post procedure instructions were discussed the patient for which he verbally understood. Follow-up in one week for nail check or sooner if any problems are to arise. Discussed signs/symptoms of worsening infection and directed to call the office immediately should any occur or go directly to the emergency room. In the meantime, encouraged to call the office with any questions, concerns, changes symptoms. -Nail sent for culture to help guide further treatment.  Return in about 2 weeks (around 01/19/2024), or if symptoms worsen or fail to improve, for nail check, culture results.  Donnice JONELLE Fees DPM

## 2024-01-26 ENCOUNTER — Telehealth: Payer: Self-pay | Admitting: Podiatry

## 2024-01-26 NOTE — Telephone Encounter (Signed)
 Patient called in asking if her biopsy is back and what the treatment plan is.

## 2024-01-27 NOTE — Telephone Encounter (Signed)
Results handed to provider

## 2024-02-25 ENCOUNTER — Other Ambulatory Visit: Payer: Self-pay | Admitting: Obstetrics and Gynecology

## 2024-02-25 DIAGNOSIS — R9230 Dense breasts, unspecified: Secondary | ICD-10-CM

## 2024-03-05 ENCOUNTER — Telehealth: Payer: Self-pay | Admitting: Podiatry

## 2024-03-05 NOTE — Telephone Encounter (Signed)
 Called for results being faxed.

## 2024-03-05 NOTE — Telephone Encounter (Signed)
 Patient called as she is trying to get results of nail culture that was done in office on 01/05/2024.

## 2024-03-08 ENCOUNTER — Other Ambulatory Visit: Payer: Self-pay | Admitting: Podiatry

## 2024-03-08 ENCOUNTER — Ambulatory Visit: Payer: Self-pay | Admitting: Podiatry

## 2024-03-08 DIAGNOSIS — Z79899 Other long term (current) drug therapy: Secondary | ICD-10-CM

## 2024-03-08 MED ORDER — TERBINAFINE HCL 250 MG PO TABS: 250.0000 mg | ORAL_TABLET | Freq: Every day | ORAL | 0 refills | Status: AC

## 2024-03-19 ENCOUNTER — Other Ambulatory Visit: Payer: Self-pay

## 2024-03-19 ENCOUNTER — Ambulatory Visit: Attending: Obstetrics and Gynecology | Admitting: Physical Therapy

## 2024-03-19 ENCOUNTER — Encounter: Payer: Self-pay | Admitting: Physical Therapy

## 2024-03-19 DIAGNOSIS — M6281 Muscle weakness (generalized): Secondary | ICD-10-CM | POA: Diagnosis present

## 2024-03-19 NOTE — Therapy (Signed)
 OUTPATIENT PHYSICAL THERAPY FEMALE PELVIC EVALUATION   Patient Name: [name] [name] MRN: [id number] DOB:09-23-1970, 53 y.o., female Today's Date: 03/19/2024  END OF SESSION:  PT End of Session - 03/19/24 1149     Visit Number 1    Date for Recertification  09/16/24    Authorization Type [employer] 2025  No Auth Required    PT Start Time 1105    PT Stop Time 1146    PT Time Calculation (min) 41 min    Activity Tolerance Patient tolerated treatment well;No increased pain    Behavior During Therapy [facility] for tasks assessed/performed          Past Medical History:  Diagnosis Date   Angio-edema    Chicken pox    Closed displaced fracture of right great toe 09/01/2018   Constipation    Fibromyalgia    Hashimoto's disease    Hyperlipidemia    Hypothyroidism    IBS (irritable bowel syndrome)    Laryngeal spasm 11/28/2021   Dr. 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   Past Surgical History:  Procedure Laterality Date   COLONOSCOPY  2016   DILATION AND CURETTAGE OF UTERUS  2006   missed ab   DILATION AND EVACUATION  08/14/2011   Procedure: DILATATION AND EVACUATION;  Surgeon: Nathanel LELON Bunker, MD;  Location: [facility];  Service: Gynecology;  Laterality: N/A;   WISDOM TOOTH EXTRACTION     Patient Active Problem List   Diagnosis Date Noted   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   Vegetarian 08/23/2019   Migraine 08/23/2019   FH: brain aneurysm 08/23/2019   Hypothyroidism due to Hashimoto's thyroiditis 01/31/2017    PCP: Catherine Charlies DELENA, DO  REFERRING PROVIDER: Bunker Nathanel, MD  REFERRING DIAG: N89.8 (ICD-10-CM) - Other specified noninflammatory disorders of vagina  THERAPY DIAG:  Muscle  weakness (generalized)  Rationale for Evaluation and Treatment: Rehabilitation  ONSET DATE: 2010  SUBJECTIVE:                                                                                                                                                                                           SUBJECTIVE STATEMENT: Patient reports that she wants to strengthen her pelvic floor, she always wears a panty liner. Vagina makes a sound. Feels like she dribbles urine all  day. Perimenopausal. This has been going on since 5th child, 15 years ago Has 4 girls and 1 boys SUI    Fluid intake: drinks all day water, one dr pepper/day  FUNCTIONAL LIMITATIONS: some urine leaking  PERTINENT HISTORY:  Medications for current condition: no Surgeries: no Other: no Sexual abuse: No  DIAGNOSTIC FINDINGS:  Post-void residual: Voiding Cystourethrogram (VCUG):  Ultrasound: PAIN:  Are you having pain? No  PRECAUTIONS: None  RED FLAGS: None   WEIGHT BEARING RESTRICTIONS: No  FALLS:  Has patient fallen in last 6 months? 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[employer], lives in [place], commutes to [place]  ACTIVITY LEVEL : walks 10 000  PLOF: Independent  PATIENT GOALS: to strengthen pelvic floor and entire body   BOWEL MOVEMENT: no issues URINATION: Pain with urination: No Fully empty bladder: Yes:                                  Post-void dribble: No Stream: Strong Urgency: No Frequency:during the day varies                                                         Nocturia: Yes: 1   Leakage: Coughing, Sneezing, and Exercise- jumping Pads/briefs: Yes:    INTERCOURSE: no issues   PREGNANCY: Vaginal deliveries 5 Tearing Yes:   Episiotomy Yes  C-section deliveries 0 Currently pregnant No  PROLAPSE: None   OBJECTIVE:  Note: Objective measures were completed at Evaluation unless otherwise noted.   PATIENT SURVEYS:    PFIQ-7: 6  COGNITION: Overall cognitive status: Within functional limits for  tasks assessed     SENSATION: Light touch: Appears intact  LUMBAR SPECIAL TESTS:  Straight leg raise test: Negative  FUNCTIONAL TESTS:   Single leg stance: positive for valgus bilateral, tremor, weakness     GAIT: Assistive device utilized: None Comments: within functional limitations   POSTURE: rounded shoulders, forward head, and increased thoracic kyphosis   LUMBARAROM/PROM:  A/PROM A/PROM  Eval (% available)  Flexion 75  Extension 75  Right lateral flexion 75  Left lateral flexion 75  Right rotation 75  Left rotation 75   (Blank rows = not tested)  LOWER EXTREMITY ROM: WFL  LOWER EXTREMITY MMT: bilateral hip weakness 4-/5  PALPATION:   Pelvic Alignment: even  Abdominal:   Diastasis: Yes: 3 fingers Distortion: No  Breathing: upper chest Scar tissue: No                External Perineal Exam: to be assessed- patient on her period today                             Internal Pelvic Floor:  to be assessed- patient on her period today  Patient confirms identification and approves PT to assess internal pelvic floor and treatment Yes  PELVIC MMT:   MMT eval  Vaginal   Internal Anal Sphincter   External Anal Sphincter   Puborectalis   Diastasis Recti 3 fingers at and above umbilicus  (Blank rows = not tested)        TONE:  to be assessed- patient on her period today  PROLAPSE:  to be assessed- patient on her period today  TODAY'S TREATMENT:                                                                                                                              DATE: 9/26  EVAL  Examination completed, findings reviewed, pt educated on POC, HEP, and female pelvic floor anatomy, reasoning with pelvic floor assessment internally with pt consent. Pt motivated to participate in PT and agreeable to attempt recommendations.     PATIENT EDUCATION/ there acts:  Education details: Pt was educated on relevant anatomy, exam findings, home exercise program,  plan of care, expectations of PT and strengthening for SUI exercises  Person educated: Patient Education method: Explanation, Demonstration, Tactile cues, Verbal cues, and Handouts Education comprehension: verbalized understanding, returned demonstration, verbal cues required, tactile cues required, and needs further education  HOME EXERCISE PROGRAM: Access Code: [email or web address] URL: [email or web address] Date: 03/19/2024 Prepared by: Cori Martinez Boxx  Program Notes exhale on exertion- blow out, engage pelvic floor and draw belly in. With repetition this will become automatic.  Exercises - Seated Pelvic Floor Contraction  - 1 x daily - 7 x weekly - 3 sets - 10 reps - Horizontal abd with TB with TRA breath  - 1 x daily - 7 x weekly - 3 sets - 10 reps - Sit to Stand with Pelvic Floor Contraction  - 1 x daily - 7 x weekly - 3 sets - 10 reps - Seated Cough with Pelvic Floor Contraction and Hand to Mouth  - 1 x daily - 7 x weekly - 3 sets - 10 reps - Pelvic Floor Contractions in Hooklying with Adduction  - 1 x daily - 7 x weekly - 3 sets - 10 reps - Supine Bridge with Pelvic Floor Contraction and Hip Rotation  - 1 x daily - 7 x weekly - 3 sets - 10 reps  ASSESSMENT:  CLINICAL IMPRESSION: Patient is a 53 y.o. F who was seen today for physical therapy evaluation and treatment for SUI. She has a history of 5 vaginal deliveries. Abdominal 3 finger diastasis present, bilateral hip weakness present, global weakness present as well. Patient did well with education on core coordination and strengthening. Patient on her period today ( perimenopausal and periods irregular). She reports that she has a hx of several miscarriages in the past. Patient is a professor at [employer], reported walking but feels weak due to not exercising because she leaks with any impact. Patient will benefit from PT to strengthen her core and pelvic floor, reduce leaking with exercise and improve quality of life. ( Patient on her  period today, internal pelvic floor exam will be performed next visit)  OBJECTIVE IMPAIRMENTS: decreased strength and impaired tone.   ACTIVITY LIMITATIONS: continence and toileting  PARTICIPATION LIMITATIONS: community activity and exercise  PERSONAL FACTORS: Time since onset of injury/illness/exacerbation are also affecting patient's functional outcome.   REHAB POTENTIAL: Good  CLINICAL DECISION MAKING: Evolving/moderate complexity  EVALUATION COMPLEXITY: Low   GOALS: Goals reviewed  with patient? Yes  SHORT TERM GOALS: Target date: 04/16/2024     Pt will be I and consistent with her HEP and demonstrate all exercises correctly  Baseline: Goal status: INITIAL  2.  Patient will report reduced urine leaking Baseline:  Goal status: INITIAL  3.  Patient will fill out bladder diary Baseline:  Goal status: INITIAL    LONG TERM GOALS: Target date: 09/16/2024  Pt will be independent with advanced HEP.   Baseline:  Goal status: INITIAL  2.  Pt will be able to run/workout for 60 minutes without leakage or discomfort  Baseline:  Goal status: INITIAL  3.  Patient will soak 0 pads/ day Baseline:  Goal status: INITIAL  4.  Patient will dem improved hip strength for lumbo pelvic support Baseline:  Goal status: INITIAL  5.  Patient will use green thera band for her exercises Baseline: red ( green too hard) Goal status: INITIAL   PLAN:  PT FREQUENCY: 1-2x/week  PT DURATION: 6 months  PLANNED INTERVENTIONS: 97110-Therapeutic exercises, 97530- Therapeutic activity, 97112- Neuromuscular re-education, 97535- Self Care, [id number]- Manual therapy, [phone]- Electrical stimulation (manual), [phone] (1-2 muscles), [phone] (3+ muscles)- Dry Needling, Patient/Family education, Taping, Joint mobilization, Joint manipulation, Spinal manipulation, Spinal mobilization, Scar mobilization, Cryotherapy, Moist heat, and Biofeedback  PLAN FOR NEXT SESSION: continue hip and core strengthening with  good coordination   Letonia Stead, PT 03/19/2024, 11:52 AM

## 2024-03-26 ENCOUNTER — Ambulatory Visit
Admission: RE | Admit: 2024-03-26 | Discharge: 2024-03-26 | Disposition: A | Discharge status: Home / Self Care | Source: Ambulatory Visit | Attending: Obstetrics and Gynecology | Admitting: Obstetrics and Gynecology

## 2024-03-26 DIAGNOSIS — R9230 Dense breasts, unspecified: Secondary | ICD-10-CM

## 2024-03-26 MED ORDER — GADOPICLENOL 0.5 MMOL/ML IV SOLN
6.0000 mL | Freq: Once | INTRAVENOUS | Status: AC | PRN
  Administered 2024-03-26: 6 mL via INTRAVENOUS

## 2024-03-30 ENCOUNTER — Other Ambulatory Visit: Payer: Self-pay | Admitting: Obstetrics and Gynecology

## 2024-03-30 DIAGNOSIS — R9389 Abnormal findings on diagnostic imaging of other specified body structures: Secondary | ICD-10-CM

## 2024-04-05 ENCOUNTER — Encounter

## 2024-04-05 ENCOUNTER — Other Ambulatory Visit

## 2024-04-05 ENCOUNTER — Ambulatory Visit
Admission: RE | Admit: 2024-04-05 | Discharge: 2024-04-05 | Disposition: A | Discharge status: Home / Self Care | Source: Ambulatory Visit | Attending: Obstetrics and Gynecology | Admitting: Obstetrics and Gynecology

## 2024-04-05 DIAGNOSIS — R9389 Abnormal findings on diagnostic imaging of other specified body structures: Secondary | ICD-10-CM

## 2024-04-05 MED ORDER — GADOPICLENOL 0.5 MMOL/ML IV SOLN
6.0000 mL | Freq: Once | INTRAVENOUS | Status: AC | PRN
  Administered 2024-04-05: 6 mL via INTRAVENOUS

## 2024-04-06 LAB — SURGICAL PATHOLOGY

## 2024-05-28 ENCOUNTER — Ambulatory Visit: Payer: Self-pay | Admitting: Physical Therapy
